# Patient Record
Sex: Male | Born: 1935 | Race: White | Hispanic: No | Smoking: Never smoker
Health system: Southern US, Community
[De-identification: ages and names within clinical notes are randomized; demographics above are authoritative.]

## PROBLEM LIST (undated history)

## (undated) DIAGNOSIS — I509 Heart failure, unspecified: Secondary | ICD-10-CM

## (undated) DIAGNOSIS — K7460 Unspecified cirrhosis of liver: Secondary | ICD-10-CM

## (undated) DIAGNOSIS — C801 Malignant (primary) neoplasm, unspecified: Secondary | ICD-10-CM

## (undated) HISTORY — PX: OTHER SURGICAL HISTORY: SHX169

## (undated) HISTORY — PX: JOINT REPLACEMENT: SHX530

## (undated) HISTORY — PX: CARDIAC SURGERY: SHX584

---

## 2004-06-25 ENCOUNTER — Ambulatory Visit: Payer: Self-pay | Admitting: Ophthalmology

## 2004-07-02 ENCOUNTER — Ambulatory Visit: Payer: Self-pay | Admitting: Ophthalmology

## 2004-08-20 ENCOUNTER — Ambulatory Visit: Payer: Self-pay | Admitting: Ophthalmology

## 2004-08-27 ENCOUNTER — Ambulatory Visit: Payer: Self-pay | Admitting: Ophthalmology

## 2004-10-16 ENCOUNTER — Ambulatory Visit: Payer: Self-pay | Admitting: Rheumatology

## 2005-09-25 ENCOUNTER — Ambulatory Visit: Payer: Self-pay | Admitting: Unknown Physician Specialty

## 2005-11-20 ENCOUNTER — Other Ambulatory Visit: Payer: Self-pay

## 2005-11-25 ENCOUNTER — Ambulatory Visit: Payer: Self-pay | Admitting: Family Medicine

## 2005-12-01 ENCOUNTER — Ambulatory Visit: Payer: Self-pay | Admitting: Internal Medicine

## 2005-12-04 ENCOUNTER — Inpatient Hospital Stay (HOSPITAL_COMMUNITY): Admission: RE | Admit: 2005-12-04 | Discharge: 2005-12-16 | Payer: Self-pay | Admitting: Surgery

## 2005-12-25 ENCOUNTER — Ambulatory Visit: Payer: Self-pay | Admitting: Internal Medicine

## 2006-01-06 ENCOUNTER — Encounter: Admission: RE | Admit: 2006-01-06 | Discharge: 2006-01-06 | Payer: Self-pay | Admitting: Surgery

## 2006-01-14 ENCOUNTER — Encounter: Payer: Self-pay | Admitting: Internal Medicine

## 2006-01-27 ENCOUNTER — Encounter: Payer: Self-pay | Admitting: Internal Medicine

## 2006-02-27 ENCOUNTER — Encounter: Payer: Self-pay | Admitting: Internal Medicine

## 2006-03-28 ENCOUNTER — Encounter: Payer: Self-pay | Admitting: Internal Medicine

## 2006-04-28 ENCOUNTER — Encounter: Payer: Self-pay | Admitting: Internal Medicine

## 2006-07-15 ENCOUNTER — Inpatient Hospital Stay (HOSPITAL_COMMUNITY): Admission: RE | Admit: 2006-07-15 | Discharge: 2006-07-18 | Payer: Self-pay | Admitting: Neurosurgery

## 2006-12-01 ENCOUNTER — Inpatient Hospital Stay (HOSPITAL_COMMUNITY): Admission: RE | Admit: 2006-12-01 | Discharge: 2006-12-07 | Payer: Self-pay | Admitting: Orthopaedic Surgery

## 2006-12-03 ENCOUNTER — Encounter: Payer: Self-pay | Admitting: Internal Medicine

## 2007-01-12 ENCOUNTER — Encounter: Payer: Self-pay | Admitting: Orthopaedic Surgery

## 2007-01-28 ENCOUNTER — Encounter: Payer: Self-pay | Admitting: Orthopaedic Surgery

## 2007-05-03 ENCOUNTER — Encounter: Admission: RE | Admit: 2007-05-03 | Discharge: 2007-05-03 | Payer: Self-pay | Admitting: Neurosurgery

## 2008-06-06 ENCOUNTER — Encounter: Admission: RE | Admit: 2008-06-06 | Discharge: 2008-06-06 | Payer: Self-pay | Admitting: Neurosurgery

## 2008-08-02 ENCOUNTER — Inpatient Hospital Stay (HOSPITAL_COMMUNITY): Admission: RE | Admit: 2008-08-02 | Discharge: 2008-08-04 | Payer: Self-pay | Admitting: Neurosurgery

## 2010-05-05 LAB — COMPREHENSIVE METABOLIC PANEL
BUN: 16 mg/dL (ref 6–23)
Creatinine, Ser: 0.96 mg/dL (ref 0.4–1.5)
GFR calc non Af Amer: >60 mL/min (ref >60)
Glucose, Bld: 100 mg/dL — ABNORMAL HIGH (ref 70–99)
Potassium: 4.8 mEq/L (ref 3.5–5.1)
Sodium: 138 mEq/L (ref 135–145)
Total Bilirubin: 1.2 mg/dL (ref 0.3–1.2)

## 2010-05-05 LAB — DIFFERENTIAL
Basophils Relative: 1 % (ref 0–1)
Eosinophils Absolute: 0.1 10*3/uL (ref 0.0–0.7)
Eosinophils Relative: 1 % (ref 0–5)
Lymphs Abs: 1.3 10*3/uL (ref 0.7–4.0)
Neutro Abs: 3 10*3/uL (ref 1.7–7.7)

## 2010-05-05 LAB — URINALYSIS, ROUTINE W REFLEX MICROSCOPIC
Urobilinogen, UA: 0.2 mg/dL (ref 0.0–1.0)
pH: 7 (ref 5.0–8.0)

## 2010-05-05 LAB — CBC
MCHC: 35.4 g/dL (ref 30.0–36.0)
MCV: 100.3 fL — ABNORMAL HIGH (ref 78.0–100.0)
Platelets: 160 10*3/uL (ref 150–400)

## 2010-05-05 LAB — PROTIME-INR: Prothrombin Time: 13.2 seconds (ref 11.6–15.2)

## 2010-06-11 NOTE — Op Note (Signed)
NAME:  Sean Jensen, Sean Jensen NO.:  [id number]   MEDICAL RECORD NO.:  [id number]          PATIENT TYPE:  INP   LOCATION:  5022                         FACILITY:  [facility]   PHYSICIAN:  Claude Manges. Whitfield, M.D.DATE OF BIRTH:  [birth year]   DATE OF PROCEDURE:  12/01/2006  DATE OF DISCHARGE:                               OPERATIVE REPORT   PREOPERATIVE DIAGNOSIS:  Osteoarthritis, right knee.   POSTOPERATIVE DIAGNOSIS:  Osteoarthritis, right knee.   PROCEDURE:  Right total knee replacement.   SURGEON:  Claude Manges. Cleophas Dunker, M.D.   ASSISTANT:  Arlys John D. Petrarca, P.A.-C.   ANESTHESIA:  General with supplemental femoral nerve block.   COMPLICATIONS:  None.   COMPONENTS:  DePuy LCS standard femoral component, a #4 keeled tibial  tray with a 12.5 mm bridging bearing, a rotating metal backed three  pegged patella.  All was secured with polymethyl methacrylate.   OPERATIVE PROCEDURE:  With the patient comfortable on the operating  table and under general orotracheal anesthesia, the nursing staff  inserted a Foley catheter, there was clear yellow urine.  The right  lower extremity was then placed in a thigh tourniquet and the leg was  then prepped with Betadine scrub and then DuraPrep from the tourniquet  to the mid foot.  Sterile draping was performed.  With the extremity  still elevated, it was Esmarch exsanguinated with a proximal tourniquet  at 350 mmHg.   A midline longitudinal incision was made centered about the patella  extending from the superior pouch to the tibial tubercle. Via sharp  dissection, the incision was carried down to the subcutaneous tissue.  The first layer of capsule was incised in the midline.  A medial  parapatellar incision was then made with the Bovie.  The joint was  entered with evidence of a clear yellow joint effusion.  The patella was  then everted 180 degrees laterally and the knee flexed to 90 degrees.  There was a fixed varus position and a  fixed flexure contracture. A  medial release was performed along the tibia.  Osteophytes were removed  about the femur, both medially and laterally.  There was complete  absence of articular cartilage in the medial femoral condyle with  eburnated bone and to a great extent along the medial tibial plateau.  There was significant chondromalacia laterally with some areas of  exposed bone.  There were large osteophytes about the patella with areas  of exposed bone.  There were also osteophytes along the medial tibial  plateau which were removed with a rongeur.  Preoperatively, we had  measured a standard femoral component.  This was confirmed  intraoperatively.   The tibial alignment jig was then applied.  The initial cut was made  transversely on the proximal tibia at about a 6 or 7 degrees posterior  declination.  We felt we were a little bit tight and removed an  additional 2 mm.  Subsequent cuts were then made on the femur with a 4  degrees distal femoral valgus cut.  MCL and LCL remained intact.  Laminar spreaders were then  inserted to both compartments were removed  and medial lateral menisci as well as PCL and LCL.  Osteophytes in the  posterior femoral condyles were removed with a curved 3/4 inch  osteotome.  There was a Baker's cyst noted posteriorly and medially and  this was removed with the Bovie.  The final cut was then made on the  femur with the tapered jig and the holes were made in the femoral  condyle to accept the femoral component.  A McKale retractor was then  placed about the tibia, it was advanced anteriorly.  We measured a #4  tibial tray, the trial jig was then applied, the center hole was made  followed by the keeled cut. After application, we trialed both the 10  and then a 12.5 mm bridging bearing and felt the 12.5 gave [place] full  extension and no opening with varus or valgus stress.   The patella was then prepared by removing 10 mm of bone leaving 15 mm of   patellar thickness.  The trial jig was applied, the three holes made,  and the trial patella was applied with a perfect fit without  subluxation.  Trial components were removed.  The joint was copiously  irrigated with jet saline.  Each of the final components were then  applied with polymethyl methacrylate with the knee in extension.  All  extraneous methacrylate was removed.  The patella was applied with  methacrylate and the patellar clamp.  After complete maturation, the  joint was explored.  There was some extraneous methacrylate in the  intercondylar notch which was removed with an osteotome.  Otherwise, it  was clear.  Bone wax was applied to bleeding bone.  The tourniquet was  deflated.  We had very little bleeding.  Marcaine 0.25% with epinephrine  was injected into the capsule.   The deep capsule was closed with interrupted #1 Ethibond, the  superficial capsule closed with running 0 Vicryl, subcu with 2-0 Vicryl,  and the skin closed with skin clips.  A sterile bulky dressing was  applied.  The patient tolerated the procedure well without  complications.      Claude Manges. Cleophas Dunker, M.D.  Electronically Signed     PWW/MEDQ  D:  12/01/2006  T:  12/01/2006  Job:  [id number]

## 2010-06-11 NOTE — Op Note (Signed)
NAME:  Sean Jensen, Sean Jensen NO.:  [id number]   MEDICAL RECORD NO.:  [id number]          PATIENT TYPE:  INP   LOCATION:  3311                         FACILITY:  [facility]   PHYSICIAN:  Payton Doughty, M.D.      DATE OF BIRTH:  Nov 16, 1921   DATE OF PROCEDURE:  07/15/2006  DATE OF DISCHARGE:                               OPERATIVE REPORT   PREOPERATIVE DIAGNOSIS:  Lumbar spondylosis with spinal stenosis at L3-  4, L4-5.   POSTOPERATIVE DIAGNOSIS:  Lumbar spondylosis with spinal stenosis at L3-  4, L4-5.   OPERATIVE PROCEDURE:  L3-4, L4-5 laminotomy and foraminotomy done  bilaterally.   SURGEON:  Payton Doughty, M.D.  Service:  Neurosurgery.   ANESTHESIA:  General endotracheal.   PREPARATION:  Sterile Betadine prep and scrub with alcohol wipe.   COMPLICATIONS:  None.   NURSE ASSISTANT:  [place]   DOCTOR ASSISTANT:  Hilda Lias, M.D.   BODY OF TEXT:  An 77 year old gentleman with neurogenic claudication  secondary to severe lumbar spondylosis, right worse the left, at L3-4  and L4-5.  Taken to the operating room and smoothly anesthetized and  intubated, placed prone on the operating table following shave, prep and  draped in the usual sterile fashion.  The skin was infiltrated with 1%  lidocaine 1:400,000 epinephrine.  The skin was incised from L3 to the  top of L5 and the lamina of L3, L4 and the top of L5 were exposed  bilaterally in subperiosteal plane.  Intraoperative x-ray showed a  marker to be under L3.  Having confirmed correctness of the level,  laminotomy and foraminotomy was carried out to the top of the ligamentum  flavum, which was removed in a retrograde fashion at each level.  The  bone was extremely thick, particularly on the right side, with  significant compression of the lateral recess.  L3-4 was a bit worse  than L4-5.  Following complete decompression of the neural foramina and  the lateral recesses, these were carefully explored and found to  be  open.  The wound was irrigated.  Hemostasis assured.  Depo-Medrol-soaked  fat was placed in the laminotomy defects.  Successive layers of 0  Vicryl, 2-0 Vicryl and 3-0 nylon were used to close.  Betadine Telfa  dressing was applied, made occlusive with OpSite.  The patient returned  to the recovery room in good condition.           ______________________________  Payton Doughty, M.D.     MWR/MEDQ  D:  07/15/2006  T:  07/15/2006  Job:  [id number]

## 2010-06-11 NOTE — H&P (Signed)
NAME:  TRESTON, COKER NO.:  [id number]   MEDICAL RECORD NO.:  [id number]          PATIENT TYPE:  INP   LOCATION:  3172                         FACILITY:  [facility]   PHYSICIAN:  Payton Doughty, M.D.      DATE OF BIRTH:  04/07/1921   DATE OF ADMISSION:  07/15/2006  DATE OF DISCHARGE:                              HISTORY & PHYSICAL   ADMISSION DIAGNOSIS:  Lumbar spondylosis L3-4, L4-5.   BODY:  Abdinasir is an 77 year old right-handed white gentleman in the past  6 to 8 years has been having increased pain in his back down his right  leg when he is up and about.  Left leg is not affected.  He noticed he  cannot stand long.  Sitting and lying poses no problems for him.  MRI  showed spinal stenoses, now admitted for a decompression L3-4 and L4-5.   PAST MEDICAL HISTORY:  Otherwise remarkable for coronary artery disease,  he had a bypass last November and is doing well.   MEDICATIONS:  1. Crestor.  2. Doxazosin.  3. Multivitamins.  4. Fexofenadine.  5. Aspirin.  6. Tylenol.   ALLERGIES:  HE HAS NO ALLERGIES.   He has no other problems.   SOCIAL HISTORY:  He does not smoke or drink.  He has retired from [employer].   FAMILY HISTORY:  Mom died at 15 of hypertension and stroke.  Father died  at 5 of stroke and cancer.   REVIEW OF SYSTEMS:  Marked for glasses, tinnitus, nasal congestion, leg  weakness, back pain, leg pain, arthritis, and skin cancer.   PHYSICAL EXAMINATION:  HEENT:  Within normal limits.  NECK:  He has reasonable range of motion of neck.  CHEST:  Clear.  CARDIAC EXAM:  Regular rate and rhythm.  I do not hear a murmur at this  time.  ABDOMEN:  Nontender with no hepatosplenomegaly.  EXTREMITIES:  No clubbing, cyanosis.  Peripheral pulses are good.  GU EXAM:  Deferred.  NEUROLOGICALLY:  He is awake, alert, and oriented.  His cranial nerves  are intact.  MOTOR EXAM:  He has 5/5 strength throughout the upper and  lower extremities and sensation not  particularly diminished.  Straight  leg raise is positive on the right.  He has somewhat swollen arthritic  right knee and there is no reflex in the knees or ankle.   IMAGING:  MRI demonstrates significant spinal stenosis L3-4 and L4-5,  which are the second and third low most levels upon his exam.  L4-5  appears to be most real effected with significant compression of right  lateral recess.   CLINICAL IMPRESSION:  Neurogenic claudication secondary to spinal  stenosis.   PLAN:  For lumbar decompression L3-4 and 4-5.  The risks and benefits  were discussed with him and he wishes to proceed.           ______________________________  Payton Doughty, M.D.     MWR/MEDQ  D:  07/15/2006  T:  07/15/2006  Job:  [id number]

## 2010-06-11 NOTE — Discharge Summary (Signed)
NAME:  Sean Jensen, Sean Jensen NO.:  [id number]   MEDICAL RECORD NO.:  [id number]          PATIENT TYPE:  INP   LOCATION:  5022                         FACILITY:  [facility]   PHYSICIAN:  Claude Manges. Whitfield, M.D.DATE OF BIRTH:  November 09, 1921   DATE OF ADMISSION:  12/01/2006  DATE OF DISCHARGE:  12/06/2006                               DISCHARGE SUMMARY   ADDENDUM:  Previous dictation of discharge summary was completed on  December 04, 2006.  Due to difficulty in obtaining the discharge summary,  the patient was unable to be transferred to the nursing facility that he  has accepted.  Therefore, we have had to keep him in the hospital until  November 10, awaiting placement.  He has had essentially an unremarkable  hospital course since his previous discharge summary.  His only problem  since has been that of constipation, which he is being treated for.  He  has had new labs which revealed a hemoglobin of 11.2, hematocrit 32.1%,  white count 6900, platelets 207,000.  Electrolytes revealed a sodium of  135, potassium 4.3, chloride 101, CO2 29, glucose 111, BUN 17,  creatinine 0.97.  His INR of November 9 was 1.4 with a PT of 17.2.  His  discharge instructions have not changed.  He will hopefully be  transferred to the skilled nursing facility on November 10 in the  morning.      Oris Drone Petrarca, P.A.-C.      Claude Manges. Cleophas Dunker, M.D.  Electronically Signed    BDP/MEDQ  D:  12/06/2006  T:  12/06/2006  Job:  [id number]

## 2010-06-11 NOTE — Discharge Summary (Signed)
NAME:  Sean Jensen, Sean Jensen NO.:  [id number]   MEDICAL RECORD NO.:  [id number]          PATIENT TYPE:  INP   LOCATION:  5022                         FACILITY:  [facility]   PHYSICIAN:  Claude Manges. Whitfield, M.D.DATE OF BIRTH:  11-Feb-1921   DATE OF ADMISSION:  12/01/2006  DATE OF DISCHARGE:  12/04/2006                               DISCHARGE SUMMARY   ADMISSION DIAGNOSIS:  Osteoarthritis of the right knee.   DISCHARGE DIAGNOSES:  1. Osteoarthritis of the right knee.  2. Hypertension.  3. Coronary artery disease.  4. Mild hyponatremia.   PROCEDURE:  Right total knee arthroplasty December 01, 2006 by Dr. Norlene Campbell, assisted by Oris Drone. Petrarca, PA-C.   HISTORY:  Sean Jensen is an 77 year old white male who presents with a 2-  year history of gradual onset of progressive right knee pain.  He denies  any history of injury or trauma.  He has pain, which is present with  weightbearing and sharp sensations diffusely about the joint with  occasional radiation up and down the leg.  It increases with standing,  walking, and lifting.  Sitting also decreases it.  Tylenol has been  moderately helpful.  He is not using assistive devices at this time.  He  has had corticosteroid injections with some relief.  He is admitted at  this time for total joint replacement.   HOSPITAL COURSE:  An 77 year old admitted December 01, 2006 after  appropriate laboratory studies were obtained, as well as Ancef 1 gram IV  on call to the operating room.  He was taken to the operating room where  he underwent a right total knee arthroplasty by Dr. Norlene Campbell,  assisted by Jacqualine Code, PA-C.  This was an LCS standard femoral  component with a #4 keel tibial tray and 12.5-mm bridging bearing.  There was a rotating metal backed 3 peg patella was also used.  He  tolerated the procedure well.  He had a Foley placed intraoperatively.  He was begun on heparin 3000 units subcu q.12 hours until his  Coumadin  became therapeutic.  Ancef 1 gram IV q.8 hours times 3 doses was  continued.  A Dilaudid reduced dose PCA pump was used for pain  management.  Lisinopril 10 mg daily was also ordered.  Consult with PT  and care management were made.  He was allowed to be weightbearing as  tolerated on the right.  He was allowed out of bed to a chair the  following day.  He did have some problems with some nausea.  A KUB was  ordered, not showing any type of ileus.  We did increase his IV fluids  to 80 an hour on the 5th because of some hypotension and decreased urine  output.  This improved tremendously.  He then had the PCA, Foley, and  heparin discontinued on the 6th of November.  The remainder of his  hospital course was uneventful.  Once he was ambulatory and stable, he  was transferred to the skilled nursing facility for continued physical  therapy and rehab.   LABORATORY  DATA:  He was admitted with a hemoglobin of 16.1, hematocrit  46.2%, white count 6200 and platelets were 206,000.  Discharge  hemoglobin was 11.6, hematocrit 33.1%, white count 10,900, platelets  156,000.  Preop sodium 137, potassium 4.2, chloride 105, CO2 24, glucose  117, BUN 19, creatinine 1.26.  GFR was 55.  Bilirubin 1.1, alk-phos 89,  SGOT 54, SGPT 45, total protein 6.9, albumin 4.4, calcium 9.4.  Discharge chemistries - sodium 134, potassium 4.0, chloride 100, CO2 27,  glucose 104, BUN 14, creatinine 1.04.  Urinalysis was benign for a  voided urine.  Blood type 0+, antibody screen negative.  Discharge INR  1.5 with a pro-time of 18.7.   RADIOGRAPHIC STUDIES:  Abdomen of November 5, revealed no obstruction or  evidence of ileus.  Chest x-ray of July 10, 2006 revealed status post  CABG. No active disease.  EKG of June 13 showed sinus rhythm with first-  degree AV block, otherwise normal EKG.   DISCHARGE INSTRUCTIONS:  He may have at discharge a regular diet.  He is  to ambulate 3-point gait, weightbearing as  tolerated on the right knee.  A CPM from 0 to 70 degrees, increasing daily by 10 degrees for 6-8 hours  per day.  He is to follow the blue instruction sheet.  No driving or  lifting for 6 weeks.  He may shower on the 8th of November.  He should  have a daily dressing change to the wound.   DISCHARGE MEDICATIONS:  1. Percocet 1-2 tablets every 4 hours as needed for pain.  2. Robaxin 500 mg 1 p.o. q.6h. p.r.n. spasms.  3. Lisinopril 10 mg daily.  4. Colace 100 mg b.i.d.  5. Tylenol 325 mg to 650 mg p.o. q.4h. p.r.n.  6. Crestor 5 mg h.s.  7. Fexofenadine 180 mg daily.  8. Nasacort AQ q.h.s.   He is not to have any doxazosin or aspirin at this time.   FOLLOW UP:  He needs to follow back up at our office 2 weeks  postoperatively for reevaluation.      Oris Drone Petrarca, P.A.-C.      Claude Manges. Cleophas Dunker, M.D.  Electronically Signed    BDP/MEDQ  D:  12/04/2006  T:  12/05/2006  Job:  [id number]

## 2010-06-11 NOTE — H&P (Signed)
NAME:  Sean Jensen, Sean Jensen NO.:  [id number]   MEDICAL RECORD NO.:  [id number]          PATIENT TYPE:  INP   LOCATION:  3109                         FACILITY:  [facility]   PHYSICIAN:  Payton Doughty, M.D.      DATE OF BIRTH:  [birth year]   DATE OF ADMISSION:  08/02/2008  DATE OF DISCHARGE:                              HISTORY & PHYSICAL   ADMITTING DIAGNOSIS:  Cervical spondylosis with myelopathy.   BODY OF TEXT:  A very nice now 77 year old right-handed white gentleman  who is operated on his back several years ago, he did relatively well.  He has had increasing pain in his neck and dysfunction of his hands.  He  has diminished grip, interosseous function, and a positive Hofmann's.  Because of this, an MR was obtained, it showed significant spinal  stenosis at C3-4 and C4-5, and he is now admitted for an anterior  decompression and fusion.   MEDICAL HISTORY:  Remarkable for coronary artery disease.  He had a  bypass in November 2007 and did well.   MEDICATIONS:  Crestor, doxazosin, vitamin, fexofenadine, aspirin, and  Tylenol.   ALLERGIES:  He has no allergies.   SURGICAL HISTORY:  He had no other operations.   SOCIAL HISTORY:  He does not smoke or drink.  He is retired from [employer].   FAMILY HISTORY:  Mom died at 66 of hypertension.  Dad died at 32 of  throat cancer.   REVIEW OF SYSTEMS:  Remarkable for glasses, tinnitus, nasal congestion,  leg weakness, back pain, leg pain, arthritis, neck pain, and skin  cancer.   PHYSICAL EXAMINATION:  HEENT:  Normal limits.  He has reasonable range  of motion in his neck with no Lhermitte's.  CHEST:  Clear.  CARDIAC:  Regular rate and  rhythm.  ABDOMEN:  Nontender with no hepatosplenomegaly.  EXTREMITIES:  Without clubbing, cyanosis.  GU:  Deferred.  Peripheral pulses are good.  NEUROLOGIC:  He is awake, alert, and oriented.  Cranial nerves are  intact.  Motor exam shows 5/5 strength throughout, save for the grip in  the  interosseous that are about 5-/5.  Sensory dysesthesias are not  reproduced with head turning.  Reflexes are 3 at the biceps, 3 at the  triceps, 2 at the brachioradialis.  Hoffmann's is mildly positive.   MRI has also been reviewed above.  Basically shows spondylosis  throughout with cord compression C3-4 and C4-5.   CLINICAL IMPRESSION:  Cervical spondylosis with early myelopathy.   PLAN:  The plan is for an anterior decompression and fusion at C3-4 and  C4-5.  The risks and benefits have been discussed with him, and he  wished to proceed.           ______________________________  Payton Doughty, M.D.     MWR/MEDQ  D:  08/02/2008  T:  08/02/2008  Job:  [id number]

## 2010-06-11 NOTE — Op Note (Signed)
NAME:  Sean Jensen, Sean Jensen NO.:  [id number]   MEDICAL RECORD NO.:  [id number]          PATIENT TYPE:  INP   LOCATION:  3109                         FACILITY:  [facility]   PHYSICIAN:  Payton Doughty, M.D.      DATE OF BIRTH:  [birth year]   DATE OF PROCEDURE:  08/02/2008  DATE OF DISCHARGE:                               OPERATIVE REPORT   PREOPERATIVE DIAGNOSIS:  Spondylosis with myelopathy at C3-4 and C4-5.   POSTOPERATIVE DIAGNOSIS:  Spondylosis with myelopathy at C3-4 and C4-5.   PROCEDURE:  C3-4, C4-5 anterior cervical decompression and fusion with  Reflex Hybrid plate.   SURGEON:  Payton Doughty, MD   ANESTHESIA:  General endotracheal.   PREPARATION:  Prepped and draped with alcohol wipe.   COMPLICATIONS:  None.   NURSE ASSISTANT:  Bedelia Person, MD   DOCTOR ASSISTANT:  Clydene Fake, MD   BODY OF TEXT:  This 77 year old gentleman with severe cervical  spondylitic myelopathy was taken to operating room, smoothly  anesthetized and intubated, placed supine on the operating table with  the neck slightly extended in the Halter head traction.  Following  shave, prep, and drape in usual sterile fashion, skin was incised from  midline to medial border of the sternocleidomastoid muscle on the left  side.  The platysma was identified, elevated, divided, and undermined.  Sternocleidomastoid was identified.  Medial dissection revealed the  carotid artery, retracted laterally to left, trachea and esophagus  retracted laterally to the right, exposing the bones in the anterior  cervical spine.  Marker was placed.  Intraoperative x-ray obtained to  confirm correctness level and confirmed correctness level.  Diskectomy  was carried out at C3-4 and C4-5 under gross observation.  We then  brought in the operating microscope with a disk space spreader in place,  used microdissection technique to remove the remaining disks, remove the  osteophyte, divide the posterior longitudinal  ligament, carefully  explore and open the foramina bilaterally.  Most of the compression was  central, it was worse at C3-4 and C4-5, and it was a combination of disk  and osteophyte.  Following complete decompression, the wound was  irrigated.  Hemostasis assured.  An 8-mm bone graft was fashioned and  patellar allograft tapped into place.  Reflex Hybrid plate of 34 mm in  length was placed using 12-mm screws, 2 in C3, 2 in C4, and 2 in C5.  Intraoperative x-ray showed good placement of bone graft,  plate, and screws.  Successive layers of 3-0 Vicryl and 4-0 Vicryl were  used to close.  Benzoin and Steri-Strips were placed and made occlusive  with Telfa and OpSite.  The patient was placed in Aspen collar, and  returned to recovery room.  The esophagus was carefully inspected and  found to be free of lesions prior to closure.           ______________________________  Payton Doughty, M.D.     MWR/MEDQ  D:  08/02/2008  T:  08/03/2008  Job:  [id number]

## 2010-06-14 NOTE — Discharge Summary (Signed)
NAME:  WOLF, BOULAY NO.:  [id number]   MEDICAL RECORD NO.:  [id number]           PATIENT TYPE:   LOCATION:                                 FACILITY:   PHYSICIAN:  Danae Orleans. Venetia Maxon, M.D.  DATE OF BIRTH:  Jan 02, 1922   DATE OF ADMISSION:  07/15/2006  DATE OF DISCHARGE:  07/18/2006                               DISCHARGE SUMMARY   ADMISSION DIAGNOSES:  1. Lumbosacral spondylosis.  2. Coronary atherosclerosis of unspecified type, status post      aortocoronary bypass.  3. Hypertrophy of the prostate without urinary obstruction.  4. Long-term use of aspirin.  5. Unspecified constipation.   FINAL DIAGNOSES:  1. Lumbosacral spondylosis.  2. Coronary atherosclerosis of unspecified type, status post      aortocoronary bypass.  3. Hypertrophy of the prostate without urinary obstruction.  4. Long-term use of aspirin.  5. Unspecified constipation.   HISTORY OF ILLNESS AND HOSPITAL COURSE:  Rashaan Bruun is an 77 year old  man with an 8-year history of pain of his right leg with neurogenic  claudication.  He has an MRI which demonstrated spinal stenosis.  He was  admitted to the hospital on a same day as procedure basis and underwent  uncomplicated decompression for spinal stenosis at L3-4 and L4-5.  Postoperatively he was gradually mobilized, did well the 21st, and was  discharged home.   FINAL DIAGNOSIS:  1. Lumbosacral spondylosis.  2. Coronary atherosclerosis of unspecified type, status post      aortocoronary bypass.  3. Hypertrophy of the prostate without urinary obstruction.  4. Long-term use of aspirin.  5. Unspecified constipation.   FOLLOW-UP:  1 week for suture removal.   DISCHARGE MEDICATIONS:  Vicodin and Valium.      Danae Orleans. Venetia Maxon, M.D.  Electronically Signed     JDS/MEDQ  D:  01/29/2007  T:  01/29/2007  Job:  [id number]

## 2010-06-14 NOTE — Discharge Summary (Signed)
NAME:  Sean Jensen, Sean Jensen                 ACCOUNT NO.:  [id number]   MEDICAL RECORD NO.:  [id number]          PATIENT TYPE:  INP   LOCATION:  2032                         FACILITY:  [facility]   PHYSICIAN:  Evelene Croon, M.D.     DATE OF BIRTH:  Jan 24, 1922   DATE OF ADMISSION:  12/04/2005  DATE OF DISCHARGE:                               DISCHARGE SUMMARY   PRIMARY DIAGNOSIS:  Severe three-vessel coronary artery disease.   IN-HOSPITAL DIAGNOSES:  1. Postoperative dysphagia.  2. Postoperative atrial fibrillation.  3. Postoperative confusion.  4. Postoperative ileus.  5. Acute renal insufficiency.   SECONDARY DIAGNOSES:  1. History of arthritis.  2. Hyperlipidemia.  3. History of benign prostatic hypertrophy.  4. Spinal stenosis.  5. Oral thrush.   IN-HOSPITAL OPERATIONS AND PROCEDURES:  Coronary artery bypass grafting  x4 using a left internal mammary artery graft to left anterior  descending coronary, saphenous vein graft to diagonal branch of the left  anterior descending, saphenous vein graft to obtuse marginal branch of  left circumflex, saphenous vein graft to posterior descending branch of  right coronary artery.  Endoscopic vein harvesting from right leg.   HISTORY AND PHYSICAL AND HOSPITAL COURSE:  The patient is an 77 year old  gentleman with no prior cardiac history who was being evaluated for  lumbar spine surgery for spinal stenosis.  A preoperative stress test  was performed due to his cardiac risk factors and showed an inferior  ischemia.  Following this the patient was taken for cardiac  catheterization by Dr. Arnoldo Hooker at [facility].  This showed severe three-vessel coronary artery disease with a  90% proximal LAD stenosis and an occluded right coronary artery that was  collateralized from the LAD.  There is also a high-grade ostial first  diagonal stenosis and a high-grade left circumflex supplying a single  marginal branch.  Left  ventricular function was well-preserved.  Following catheterization, the patient was referred to Dr. Laneta Simmers to  undergo evaluation for a possible coronary artery bypass grafting.  Dr.  Laneta Simmers saw the patient in the office and discussed surgery with the  patient.  Risks and benefits were discussed.  The patient acknowledged  understanding and agreed to proceed.  Surgery was scheduled for December 04, 2005.  For details of the patient's past medical history and physical  exam please see dictated history and physical.   The patient was taken to the operating room December 04, 2005 where he  underwent coronary artery bypass grafting x4 using a left internal  mammary artery graft to left anterior descending, saphenous vein graft  to diagonal branch, saphenous vein graft to obtuse marginal branch,  saphenous vein graft to posterior descending branch.  Endoscopic vein  harvesting was done from the right leg.  The patient tolerated this  procedure well and was transferred to the intensive care unit in stable  condition.  Following surgery the patient was seen to be hemodynamically  stable.  The patient was extubated later evening/early morning following  surgery.  Following extubation the patient did display some  postoperative confusion.  This did resolve by postop day #3.  He was  alert and oriented x3 on day #3.  Postop day #1 the patient's creatinine  was noted to have elevated to 1.6.  By later that evening he had  increased to 2.7.  Renal dopamine was started at that time.  His  creatinine was monitored closely.  By postop day #4 it had come back to  normal and was 1.1.  It remained stable during the remainder of his  hospital course.  Postop day #2 the patient was diagnosed to have mild  to moderate colonic ileus per KUB.  Placed n.p.o. at that time.  Follow-  up KUB was obtained in the morning.  The patient's bowels were moving by  that morning.  Follow-up KUB showed improved gaseous  distension.  He was  started on diet slowly.  After initiation of diet, the patient was  complaining of dysphagia.  Speech pathology was consulted.  A bedside  swallow evaluation was done December 08, 2005 and this showed clinical  signs of oropharyngeal dysphagia.  The patient was started on D3 with  nectar thick liquids.  A FES was done December 09, 2005 which showed  moderate pharyngeal dysphagia characterized by penetration of vocal  cords with thin liquids.  The patient was considered on D3 nectar-thick  liquids.  A repeat fiberoptic endoscopic evaluation ordered for today  which has currently not been done yet.  The patient also developed  postoperative atrial fibrillation day #2.  He was started on IV  amiodarone.  After initiation of the amiodarone, the patient converted  back to normal sinus rhythm.  He remained in normal sinus rhythm and  switched to p.o.  The patient had no other bouts of atrial fibrillation.   The patient was transferred out to 2000 postop day #5.  He was out of  bed ambulating well with assistance.  Vital signs were monitored and  seemed to be stable.  He was able to be weaned off oxygen saturating  greater than 90% on room air.  Incision is clean, dry and intact and  healing well.  The patient remained hemodynamically stable  postoperatively.  His last hemoglobin and hematocrit 12.6 and 36.6.  He  was afebrile postoperatively.  The patient was back near his  preoperative weight prior to discharge home.  He did develop oral thrush  and was started on nystatin prior to discharge home.   The patient is tentatively ready for discharge home in morning.   FOLLOW-UP APPOINTMENTS:  A follow-up appointment has been arranged with  Dr. Laneta Simmers for January 06, 2006 at 11:45 a.m.  The patient will need to  obtain AP and lateral chest x-ray 1 hour prior to this appointment.  He will need to contact Dr. Philemon Kingdom office to schedule follow-up  appointment with him in 2  weeks.   ACTIVITY:  Patient instructed no driving until released to do so, no  heavy lifting over 10 pounds.  He is to ambulate two to four times per  day, progress as tolerated and continue his breathing exercises.  Home  health nurse, PT, speech therapy and aid have been arranged.   DIET:  Pending patient's FES study today will base if the patient will  remain D3 nectar-thick liquids versus advancing diet.   INCISIONAL CARE:  The patient to shower washing his incisions using soap  and water.  He is contact the office if he develops any drainage or  opening from any of  his incision sites.   DISCHARGE MEDICATIONS:  1. Aspirin 325 mg daily.  2. Lopressor 50 mg b.i.d.  3. Crestor 5 mg at night.  4. Amiodarone 200 mg b.i.d.  5. Doxazosin 4 mg at night.  6. Lyrica 50 mg b.i.d.  7. Multivitamin daily.  8. Fexofenadine 180 mg daily.  9. Oxycodone 5 mg one to two tablets q.4-6 h p.r.n. pain.      Theda Belfast, [place]      Evelene Croon, M.D.  Electronically Signed    KMD/MEDQ  D:  12/15/2005  T:  12/15/2005  Job:  [id number]   cc:   Arnoldo Hooker, MD

## 2010-06-14 NOTE — Op Note (Signed)
NAME:  Sean Jensen, Sean Jensen                 ACCOUNT NO.:  [id number]   MEDICAL RECORD NO.:  [id number]          PATIENT TYPE:  INP   LOCATION:  2315                         FACILITY:  [facility]   PHYSICIAN:  Evelene Croon, M.D.     DATE OF BIRTH:  [birth year]   DATE OF PROCEDURE:  12/04/2005  DATE OF DISCHARGE:                               OPERATIVE REPORT   PREOPERATIVE DIAGNOSIS:  Severe three-vessel coronary disease.   POSTOPERATIVE DIAGNOSIS:  Severe three-vessel coronary disease.   OPERATIVE PROCEDURE:  Median sternotomy, extracorporeal circulation,  coronary artery bypass graft surgery x4 using a left internal mammary  artery graft to the left anterior descending coronary artery, with a  saphenous vein graft to the diagonal branch of the left anterior  descending, a saphenous vein graft to the obtuse marginal branch of the  left circumflex coronary artery, and a saphenous vein graft to the  posterior descending branch of the right coronary artery.  Endoscopic  vein harvesting from the right leg.   ATTENDING SURGEON:  Evelene Croon, M.D.   ASSISTANT:  Belenda Cruise Dominic, P.A.-C.   ANESTHESIA:  General endotracheal.   CLINICAL HISTORY:  This patient is an 77 year old gentleman with no  prior cardiac history, who was being evaluated for lumbar spine surgery  for spinal stenosis.  A preoperative stress test was performed due to  his cardiac risk factors and showed inferior ischemia.  He underwent  cardiac catheterization by Dr. Arnoldo Hooker at [facility] which showed severe three-vessel coronary artery disease  with a 90% proximal LAD stenosis and an occluded right coronary artery  that was collateralized from the LAD.  There was also a high-grade  ostial first diagonal stenosis and a high-grade left circumflex stenosis  supplying a single marginal branch.  Left ventricular function was well-  preserved.  After review of the angiogram and examination of the  patient,  it was felt that coronary artery bypass graft surgery was the  best treatment to prevent further ischemia and infarction and allow him  to safely undergo spine surgery.  I discussed the operative procedure  with the patient and his sister including alternatives, benefits, and  risks including bleeding, blood transfusion, infection, stroke,  myocardial infarction, graft failure, and death.  They understood and  agreed to proceed.   OPERATIVE PROCEDURE:  The patient was taken to the operating room and  placed on the table in a supine position.  After induction of general  endotracheal anesthesia, a Foley catheter was placed in the bladder  using sterile technique.  Then the chest, abdomen and both lower  extremities were prepped and draped in usual sterile manner.  The chest  was entered through a median sternotomy incision and the pericardium  opened in the midline.  Examination of the heart showed good ventricular  contractility.  The ascending aorta had no palpable plaques in it.   Then the left internal mammary artery was harvested from the chest wall  as a pedicle graft.  This was a medium-caliber vessel with excellent  blood flow through it.  At the  same time, a segment of greater saphenous  vein was harvested from the right leg using endoscopic vein harvest  technique.  This vein was of medium size and good quality.   The patient then heparinized and when an adequate activated clotting  time was achieved, the distal ascending aorta was cannulated using a 20-  [place] aortic cannula for arterial inflow.  Venous outflow was achieved  using a two-stage venous cannula through the right atrial appendage.  An  antegrade cardioplegia and vent cannula was inserted in the aortic root.   The patient was placed on cardiopulmonary bypass and the distal  coronaries identified.  The LAD was a medium-sized graftable vessel.  The first diagonal branch was small, but graftable.  The obtuse  marginal  was a small- to medium-sized vessel that was lying high on the lateral  wall and was located proximally in the fat near the atrioventricular  groove and was felt to be graftable here.  The right coronary artery  gave off a large posterior descending branch that had mild distal  disease in it and a small posterolateral branch.  The posterolateral  branch was fairly small and had diffuse disease in it and was not felt  to be graftable.  Both this and the posterior descending branch  communicate on angiogram.   Then the aorta was crossclamped and 500 mL of cold blood antegrade  cardioplegia were administered in the aortic root with quick arrest of  the heart.  Systemic hypothermia to 28 degrees centigrade and topical  hypothermia with iced saline were used.  A temperature probe was placed  in septum and insulating pad in the pericardium.   The first distal anastomosis was performed to the posterior descending  coronary artery.  The internal diameter of this vessel was 1.75 mm.  The  conduit that was used was a segment of greater saphenous vein and the  anastomosis performed in an end-to-side manner using continuous 7-0  Prolene suture.  Flow was measured through the graft and was excellent.   The second distal anastomosis was performed to the obtuse marginal  branch.  The internal diameter of this vessel was about 1.6 mm.  The  conduit that was used was a second segment of greater saphenous vein and  the anastomosis performed in an end-to-side manner using continuous 7-0  Prolene suture.  Flow was measured through the graft and was excellent.  Then a dose of cardioplegia was given down both vein grafts and in the  aortic root.   The third distal anastomosis was performed to the diagonal branch.  The  internal diameter was about 1.6 mm.  The conduit used was a third segment of greater saphenous vein and the anastomosis performed in an  end-to-side manner using continuous 7-0  Prolene suture.  Flow was  measured through the graft and was excellent.   The fourth distal anastomosis was performed to the midportion of the  left anterior descending coronary artery.  The internal diameter of this  vessel was about 2 mm.  The conduit that was used was a left internal  mammary graft and this was brought through an opening in the left  pericardium anterior to the phrenic nerve.  It was anastomosed to the  LAD in an end-to-side manner using continuous 8-0 Prolene suture.  The  pedicle was sutured to the epicardium with 6-0 Prolene sutures.  The  patient was rewarmed to 37 degrees centigrade.  With the crossclamp in  place, the  3 proximal vein graft anastomoses were performed to the  aortic root in an end-to-side manner using continuous 6-0 Prolene  suture.  Then the clamp was removed from mammary artery pedicle.  There  was rapid warming of the ventricular septum and return of spontaneous  ventricular fibrillation.  The crossclamp was removed with a time of 78  minutes and the patient converted into sinus arrest.  Then 2 temporary  right ventricular and right atrial pacing wires were placed and brought  out through the skin.  The patient was paced DDD at 90.  The proximal  and distal anastomoses were examined and were hemostatic.  The lines of  the grafts were satisfactory.  Graft markers were placed around the  proximal anastomoses.   When the patient had rewarmed to 37 degrees centigrade, he was weaned  from cardiopulmonary bypass on no inotropic agents.  Total bypass time  was 101 minutes.  Cardiac function appeared excellent with a cardiac  output of 4 L/min.  Protamine was given and the venous and aortic  cannulas were removed without difficulty.  Hemostasis was achieved.  Three chest tubes were placed with a tube in the posterior pericardium,  1 in the left pleural space and 1 in the anterior mediastinum.  The  pericardium was loosely reapproximated over the  heart.  Sternum was  closed with #6 stainless steel wires.  Fascia was closed with a  continuous #1 Vicryl suture.  Subcutaneous tissue was closed with a  continuous 2-0 Vicryl and skin with 3-0 Vicryl subcuticular closure.  The lower extremity vein harvest site was closed in layers in a similar  manner.  The sponge, needles and instrument counts were correct  according to the scrub nurse.  Dry sterile dressings were applied over  the incisions and around the chest tubes, which were hooked to Pleur-  evac suction.  The patient remained hemodynamically stable and was  transported to the SICU in guarded, but stable condition.      Evelene Croon, M.D.  Electronically Signed     BB/MEDQ  D:  12/04/2005  T:  12/05/2005  Job:  [id number]   cc:   Almon Register, [place], [place] Arnoldo Hooker MD  [facility] Cardiac Catheterization Laboratory

## 2010-09-25 ENCOUNTER — Other Ambulatory Visit: Payer: Self-pay | Admitting: Neurosurgery

## 2010-09-25 DIAGNOSIS — M545 Low back pain: Secondary | ICD-10-CM

## 2010-09-26 ENCOUNTER — Ambulatory Visit
Admission: RE | Admit: 2010-09-26 | Discharge: 2010-09-26 | Disposition: A | Discharge status: Home / Self Care | Payer: Medicare Other | Source: Ambulatory Visit | Attending: Neurosurgery | Admitting: Neurosurgery

## 2010-09-26 DIAGNOSIS — M545 Low back pain, unspecified: Secondary | ICD-10-CM

## 2010-10-09 ENCOUNTER — Other Ambulatory Visit: Payer: Self-pay | Admitting: Neurosurgery

## 2010-10-09 DIAGNOSIS — M47816 Spondylosis without myelopathy or radiculopathy, lumbar region: Secondary | ICD-10-CM

## 2010-10-10 ENCOUNTER — Ambulatory Visit
Admission: RE | Admit: 2010-10-10 | Discharge: 2010-10-10 | Disposition: A | Discharge status: Home / Self Care | Payer: Medicare Other | Source: Ambulatory Visit | Attending: Neurosurgery | Admitting: Neurosurgery

## 2010-10-10 VITALS — BP 186/80 | HR 87

## 2010-10-10 DIAGNOSIS — M47816 Spondylosis without myelopathy or radiculopathy, lumbar region: Secondary | ICD-10-CM

## 2010-10-10 MED ORDER — METHYLPREDNISOLONE ACETATE 40 MG/ML INJ SUSP (RADIOLOG
120.0000 mg | Freq: Once | INTRAMUSCULAR | Status: AC
  Administered 2010-10-10: 120 mg via EPIDURAL

## 2010-10-10 MED ORDER — IOHEXOL 180 MG/ML  SOLN
1.0000 mL | Freq: Once | INTRAMUSCULAR | Status: AC | PRN
  Administered 2010-10-10: 1 mL via EPIDURAL

## 2010-11-05 LAB — BASIC METABOLIC PANEL
BUN: 19
BUN: 20
CO2: 27
CO2: 29
Calcium: 8.3 — ABNORMAL LOW
Calcium: 8.4
Creatinine, Ser: 0.97
Creatinine, Ser: 1.04
Creatinine, Ser: 1.08
GFR calc Af Amer: 51 — ABNORMAL LOW
GFR calc Af Amer: >60
GFR calc non Af Amer: 42 — ABNORMAL LOW
GFR calc non Af Amer: >60
GFR calc non Af Amer: >60
GFR calc non Af Amer: >60
Glucose, Bld: 104 — ABNORMAL HIGH
Glucose, Bld: 108 — ABNORMAL HIGH
Glucose, Bld: 111 — ABNORMAL HIGH
Potassium: 4.1
Sodium: 134 — ABNORMAL LOW
Sodium: 135
Sodium: 136

## 2010-11-05 LAB — CBC
HCT: 37.1 — ABNORMAL LOW
Hemoglobin: 11.2 — ABNORMAL LOW
Hemoglobin: 11.6 — ABNORMAL LOW
MCHC: 34.8
MCHC: 35
Platelets: 144 — ABNORMAL LOW
Platelets: 187
Platelets: 207
RBC: 3.4 — ABNORMAL LOW
RBC: 3.8 — ABNORMAL LOW
RDW: 12.5
RDW: 12.8
RDW: 12.9
WBC: 13.3 — ABNORMAL HIGH
WBC: 8.8

## 2010-11-05 LAB — PROTIME-INR
INR: 1.1
INR: 1.4
INR: 1.5
Prothrombin Time: 14.3
Prothrombin Time: 17.2 — ABNORMAL HIGH
Prothrombin Time: 17.5 — ABNORMAL HIGH
Prothrombin Time: 18.5 — ABNORMAL HIGH
Prothrombin Time: 18.7 — ABNORMAL HIGH
Prothrombin Time: 19.6 — ABNORMAL HIGH

## 2010-11-06 LAB — CBC
HCT: 46.2
Hemoglobin: 16.1
MCV: 97.2
Platelets: 206
RDW: 12.5

## 2010-11-06 LAB — CROSSMATCH: Antibody Screen: NEGATIVE

## 2010-11-06 LAB — DIFFERENTIAL
Basophils Absolute: 0
Basophils Relative: 0
Lymphocytes Relative: 24
Monocytes Relative: 12 — ABNORMAL HIGH
Neutro Abs: 3.8
Neutrophils Relative %: 62

## 2010-11-06 LAB — URINALYSIS, ROUTINE W REFLEX MICROSCOPIC
Bilirubin Urine: NEGATIVE
Ketones, ur: NEGATIVE
Nitrite: NEGATIVE
Urobilinogen, UA: 1

## 2010-11-06 LAB — COMPREHENSIVE METABOLIC PANEL
Alkaline Phosphatase: 89
BUN: 19
Creatinine, Ser: 1.26
Glucose, Bld: 117 — ABNORMAL HIGH
Potassium: 4.2
Total Bilirubin: 1.1
Total Protein: 6.9

## 2010-11-06 LAB — APTT: aPTT: 25

## 2010-11-06 LAB — PROTIME-INR: INR: 0.9

## 2010-11-14 LAB — TYPE AND SCREEN
ABO/RH(D): O POS
Antibody Screen: NEGATIVE

## 2010-11-14 LAB — URINALYSIS, ROUTINE W REFLEX MICROSCOPIC
Bilirubin Urine: NEGATIVE
Hgb urine dipstick: NEGATIVE
Ketones, ur: NEGATIVE
Nitrite: NEGATIVE
Protein, ur: NEGATIVE
Specific Gravity, Urine: 1.028 (ref 1.005–1.035)
Urobilinogen, UA: 0.2

## 2010-11-14 LAB — DIFFERENTIAL
Basophils Absolute: 0
Eosinophils Relative: 1
Lymphocytes Relative: 29

## 2010-11-14 LAB — CBC
HCT: 44
Hemoglobin: 15.2
MCV: 98.1
RBC: 4.49
WBC: 5.1

## 2010-11-14 LAB — COMPREHENSIVE METABOLIC PANEL
AST: 50 — ABNORMAL HIGH
BUN: 20
CO2: 24
Chloride: 105
Creatinine, Ser: 0.93
GFR calc non Af Amer: >60
Glucose, Bld: 94
Total Bilirubin: 1.3 — ABNORMAL HIGH

## 2010-11-14 LAB — PROTIME-INR: Prothrombin Time: 13.1

## 2013-08-24 DIAGNOSIS — R0602 Shortness of breath: Secondary | ICD-10-CM | POA: Insufficient documentation

## 2013-12-15 ENCOUNTER — Ambulatory Visit: Payer: Self-pay | Admitting: Specialist

## 2013-12-15 LAB — BODY FLUID CELL COUNT WITH DIFFERENTIAL
BASOS ABS: 0 %
EOS PCT: 0 %
LYMPHS PCT: 66 %
NUCLEATED CELL COUNT: 1118 /mm3
Neutrophils: 6 %
OTHER CELLS BF: 0 %
Other Mononuclear Cells: 28 %

## 2013-12-15 LAB — GLUCOSE, SEROUS FLUID: Glucose, Body Fluid: 96 mg/dL

## 2013-12-15 LAB — PROTEIN, BODY FLUID: PROTEIN, BODY FLUID: 2.8 g/dL

## 2013-12-15 LAB — LACTATE DEHYDROGENASE, PLEURAL OR PERITONEAL FLUID: LDH, BODY FLUID: 110 U/L

## 2013-12-19 LAB — BODY FLUID CULTURE

## 2013-12-27 ENCOUNTER — Ambulatory Visit: Payer: Self-pay | Admitting: Internal Medicine

## 2013-12-31 ENCOUNTER — Observation Stay: Payer: Self-pay | Admitting: Internal Medicine

## 2013-12-31 LAB — COMPREHENSIVE METABOLIC PANEL
ALK PHOS: 228 U/L — AB
ANION GAP: 10 (ref 7–16)
AST: 77 U/L — AB (ref 15–37)
Albumin: 2.7 g/dL — ABNORMAL LOW (ref 3.4–5.0)
BILIRUBIN TOTAL: 1.6 mg/dL — AB (ref 0.2–1.0)
BUN: 25 mg/dL — AB (ref 7–18)
CALCIUM: 8.2 mg/dL — AB (ref 8.5–10.1)
CHLORIDE: 105 mmol/L (ref 98–107)
CO2: 20 mmol/L — AB (ref 21–32)
CREATININE: 0.88 mg/dL (ref 0.60–1.30)
EGFR (African American): >60
EGFR (Non-African Amer.): >60
GLUCOSE: 98 mg/dL (ref 65–99)
Osmolality: 274 (ref 275–301)
POTASSIUM: 5.1 mmol/L (ref 3.5–5.1)
SGPT (ALT): 34 U/L
SODIUM: 135 mmol/L — AB (ref 136–145)
TOTAL PROTEIN: 6.6 g/dL (ref 6.4–8.2)

## 2013-12-31 LAB — CBC
HCT: 33.9 % — ABNORMAL LOW (ref 40.0–52.0)
HGB: 11.1 g/dL — AB (ref 13.0–18.0)
MCH: 30.3 pg (ref 26.0–34.0)
MCHC: 32.7 g/dL (ref 32.0–36.0)
MCV: 92 fL (ref 80–100)
Platelet: 68 10*3/uL — ABNORMAL LOW (ref 150–440)
RBC: 3.67 10*6/uL — ABNORMAL LOW (ref 4.40–5.90)
RDW: 15.9 % — AB (ref 11.5–14.5)
WBC: 4.3 10*3/uL (ref 3.8–10.6)

## 2013-12-31 LAB — TROPONIN I: Troponin-I: <0.02 ng/mL

## 2014-01-01 LAB — COMPREHENSIVE METABOLIC PANEL
ALT: 26 U/L
Albumin: 2.5 g/dL — ABNORMAL LOW (ref 3.4–5.0)
Alkaline Phosphatase: 206 U/L — ABNORMAL HIGH
Anion Gap: 4 — ABNORMAL LOW (ref 7–16)
BUN: 26 mg/dL — AB (ref 7–18)
Bilirubin,Total: 1 mg/dL (ref 0.2–1.0)
CALCIUM: 7.8 mg/dL — AB (ref 8.5–10.1)
CHLORIDE: 105 mmol/L (ref 98–107)
CO2: 25 mmol/L (ref 21–32)
Creatinine: 1.03 mg/dL (ref 0.60–1.30)
EGFR (African American): >60
EGFR (Non-African Amer.): >60
Glucose: 109 mg/dL — ABNORMAL HIGH (ref 65–99)
OSMOLALITY: 274 (ref 275–301)
Potassium: 3.6 mmol/L (ref 3.5–5.1)
SGOT(AST): 34 U/L (ref 15–37)
SODIUM: 134 mmol/L — AB (ref 136–145)
TOTAL PROTEIN: 6.1 g/dL — AB (ref 6.4–8.2)

## 2014-01-01 LAB — BASIC METABOLIC PANEL
ANION GAP: 10 (ref 7–16)
BUN: 24 mg/dL — ABNORMAL HIGH (ref 7–18)
CHLORIDE: 100 mmol/L (ref 98–107)
Calcium, Total: 7.9 mg/dL — ABNORMAL LOW (ref 8.5–10.1)
Co2: 24 mmol/L (ref 21–32)
Creatinine: 1.12 mg/dL (ref 0.60–1.30)
GLUCOSE: 115 mg/dL — AB (ref 65–99)
Osmolality: 273 (ref 275–301)
Potassium: 3.6 mmol/L (ref 3.5–5.1)
Sodium: 134 mmol/L — ABNORMAL LOW (ref 136–145)

## 2014-01-01 LAB — LIPID PANEL
Cholesterol: 103 mg/dL (ref 0–200)
HDL: 43 mg/dL (ref 40–60)
Ldl Cholesterol, Calc: 41 mg/dL (ref 0–100)
Triglycerides: 95 mg/dL (ref 0–200)
VLDL CHOLESTEROL, CALC: 19 mg/dL (ref 5–40)

## 2014-01-01 LAB — CBC WITH DIFFERENTIAL/PLATELET
BASOS ABS: 0 10*3/uL (ref 0.0–0.1)
Basophil %: 0.4 %
Eosinophil #: 0 10*3/uL (ref 0.0–0.7)
Eosinophil %: 1.2 %
HCT: 32.4 % — ABNORMAL LOW (ref 40.0–52.0)
HGB: 10.6 g/dL — ABNORMAL LOW (ref 13.0–18.0)
LYMPHS ABS: 0.4 10*3/uL — AB (ref 1.0–3.6)
Lymphocyte %: 12.2 %
MCH: 30.3 pg (ref 26.0–34.0)
MCHC: 32.8 g/dL (ref 32.0–36.0)
MCV: 92 fL (ref 80–100)
MONOS PCT: 14 %
Monocyte #: 0.5 x10 3/mm (ref 0.2–1.0)
NEUTROS PCT: 72.2 %
Neutrophil #: 2.5 10*3/uL (ref 1.4–6.5)
Platelet: 68 10*3/uL — ABNORMAL LOW (ref 150–440)
RBC: 3.51 10*6/uL — AB (ref 4.40–5.90)
RDW: 16 % — AB (ref 11.5–14.5)
WBC: 3.4 10*3/uL — AB (ref 3.8–10.6)

## 2014-01-01 LAB — TSH: Thyroid Stimulating Horm: 4.04 u[IU]/mL

## 2014-01-01 LAB — MAGNESIUM: MAGNESIUM: 1.8 mg/dL

## 2014-01-02 LAB — BASIC METABOLIC PANEL
ANION GAP: 10 (ref 7–16)
BUN: 23 mg/dL — AB (ref 7–18)
CALCIUM: 7.8 mg/dL — AB (ref 8.5–10.1)
CO2: 25 mmol/L (ref 21–32)
CREATININE: 1 mg/dL (ref 0.60–1.30)
Chloride: 100 mmol/L (ref 98–107)
EGFR (Non-African Amer.): >60
GLUCOSE: 105 mg/dL — AB (ref 65–99)
Osmolality: 274 (ref 275–301)
Potassium: 3.4 mmol/L — ABNORMAL LOW (ref 3.5–5.1)
Sodium: 135 mmol/L — ABNORMAL LOW (ref 136–145)

## 2014-01-05 LAB — CULTURE, FUNGUS WITHOUT SMEAR

## 2014-01-05 LAB — CULTURE, BLOOD (SINGLE)

## 2014-01-27 ENCOUNTER — Ambulatory Visit: Payer: Self-pay | Admitting: Internal Medicine

## 2014-03-09 ENCOUNTER — Ambulatory Visit: Payer: Self-pay | Admitting: Gastroenterology

## 2014-03-26 DIAGNOSIS — R188 Other ascites: Secondary | ICD-10-CM | POA: Insufficient documentation

## 2014-03-26 DIAGNOSIS — K7460 Unspecified cirrhosis of liver: Secondary | ICD-10-CM | POA: Insufficient documentation

## 2014-03-28 ENCOUNTER — Ambulatory Visit
Admit: 2014-03-28 | Disposition: A | Discharge status: Home / Self Care | Payer: Self-pay | Attending: Gastroenterology | Admitting: Gastroenterology

## 2014-03-29 ENCOUNTER — Ambulatory Visit: Payer: Self-pay | Admitting: Gastroenterology

## 2014-05-20 NOTE — Consult Note (Signed)
   Comments   Pt is being discharged and I will not be able to meet with pt's children.  Spoke with pt and will make him a DNR.  Showed him out of facility DNR that will be with his discharge paperwork.  Pt A/O x 3 and understands this decision and states that he will speak to his children about his wishes.  Pt understands he could reverse this decision if he wanted to.  Electronic Signatures: Samvel Zinn, Judith Part (NP)  (Signed 07-Dec-15 14:57)  Authored: Palliative Care Phifer, Izora Gala (MD)  (Signed 07-Dec-15 16:44)  Authored: Palliative Care   Last Updated: 07-Dec-15 16:44 by Phifer, Izora Gala (MD)

## 2014-05-20 NOTE — H&P (Signed)
PATIENT NAME:  Sean Jensen, Sean Jensen MR#:  [id number] DATE OF BIRTH:  1921-05-23  DATE OF ADMISSION:  12/31/2013   PRIMARY CARE PROVIDER:  [place] Pink, MD   EMERGENCY DEPARTMENT REFERRING PHYSICIAN: Lavonia Drafts, MD   CHIEF COMPLAINT: Shortness of breath, right-sided chest pain.   HISTORY OF PRESENT ILLNESS:  The patient is a 81 year old white male, who developed shortness of breath about 2 months ago.  At that time, he was in [place].  There, he was seen at an urgent care and at that time he was diagnosed possible pneumonia, given some antibiotics and some Lasix.  Also noticed to have pleural effusion.  The patient was subsequently given those treatments and improvement in his symptoms; however 2 weeks ago, shortness of breath got worse and then he had a repeat chest x-ray, was seen by Dr. Raul Del, had an outpatient thoracentesis done on 11/19, had 1.5 liters of fluid removal. The fluid was transudative in nature. He reports that he was feeling better for a few days but then he started having swelling in the lower extremity and then started having shortness of breath and now has progressively gotten worse.  The patient came to the ED, had a chest x-ray which shows reaccumulation of the right side of the effusion, associated right middle and lower lobe collapse.  The patient otherwise also complains of sharp right-sided chest pain, but does have a dry cough, but no wheezing. Denies any nausea, vomiting or diarrhea. Denies any urinary symptoms. He denies any weight loss. C  PAST MEDICAL HISTORY:  Hyperlipidemia, history of atrial fibrillation, coronary artery disease.   PAST SURGICAL HISTORY: Status post coronary artery bypass graft, had C-spine surgery and right knee surgery.   ALLERGIES: None.   MEDICATIONS AT HOME:  He is on vitamin C 1000 mg daily, Tylenol p.r.n., multivitamin daily, gabapentin 300 daily, Lasix 20 mg 1 tab p.o. daily, fluticasone 50 mcg 1 puff daily, fexofenadine 180 daily,  doxazosin 4 mg daily, cyanocobalamin 1000 mcg daily, Crestor 5 daily.   SOCIAL HISTORY: Does not smoke. Does not drink. No drugs.   FAMILY HISTORY: Positive for hypertension.   REVIEW OF SYSTEMS:  CONSTITUTIONAL: Denies some fatigue, weakness. No weight loss or weight gain.  HEENT: Denies any blurred vision, double vision, no redness of the eyes, denies any nasal drainage or congestion. No epistaxis. No seasonal or year-round allergies. Has chronic hearing difficulty. He has hearing aids. Denies any difficulty with swallowing.  CARDIOVASCULAR: Denies any left-sided chest pain. No history of coronary artery disease with no with CABG, no syncope.  Complains of dyspnea on exertion. No history of possible atrial fibrillation.  PULMONARY: Complains of cough, dyspnea on exertion. No wheezing. No hemoptysis. No chronic obstructive pulmonary disease, no tuberculosis.  GASTROINTESTINAL: Denies any nausea, vomiting, diarrhea. No abdominal pain. No hematemesis. No hematochezia.  GENITOURINARY: Denies any frequency, urgency or hesitancy.  SKIN: Denies any rash.  LYMPHATICS: Denies any lymph node enlargement.  VASCULAR: Denies any claudication symptoms.  NEUROLOGIC: Denies CVA, transient ischemic attack or seizures.  PSYCHIATRIC: No anxiety, insomnia, or ADD.   PHYSICAL EXAMINATION:  VITAL SIGNS: Temperature 98.5, pulse 58, respirations 18, blood pressure 131/77, O2 of 93%.  GENERAL: The patient is a well-developed, well-nourished male in no acute distress.  HEENT: Head atraumatic, normocephalic. Pupils equally round, reactive to light and accommodation. There is no conjunctiva pallor.  No sclerae icterus.  Nasal exam shows no drainage or ulceration.  OROPHARYNX: Clear without any exudate.  NECK: Supple without  any JVD. No thyromegaly.  CARDIOVASCULAR: Regular rate and rhythm. No murmurs, rubs, clicks, or gallops. PMI is not displaced.   LUNGS: Diminished breath sounds in the right lung base. There is  no wheezing or rhonchi.  ABDOMEN: Soft, nontender, distended with some fluid wave. Positive bowel sounds x 4. No hepatosplenomegaly.  EXTREMITIES: He has 2+ edema.  LYMPH NODES: Nonpalpable.  VASCULAR: Good DP pulses.   PSYCHIATRIC:  Not anxious or depressed.  NEUROLOGIC: Awake, alert, and oriented x 3. No focal deficits.  LYMPH NODES: Nonpalpable.   LABORATORY, DIAGNOSTIC AND RADIOLOGICAL DATA:  Glucose 98, BUN 25, creatinine 0.88, sodium 135, potassium 5.1, chloride 105, CO2 is 20, calcium is 8.2.  LFTs: Total protein 6.6, albumin 2.7, bilirubin total 1.6, alkaline phosphatase 228, AST 77, ALT 34, troponin less than 0.02. WBC 4.3, hemoglobin 11.1, platelet count is 68,000.  Chest x-ray shows enlarging right effusion with right middle and lower lobe collapse.      ASSESSMENT AND PLAN: The patient is a 81 year old with history of transudative effusion in the past presents with right-sided chest pain, shortness of breath.  1.  Recurrent pleural effusion which was transudative based on previous evaluation. Differential diagnoses includes possible cardiac cause, as well as possible liver cirrhosis as a cause of the ascites. With the low platelet count, there is concern that he may have some liver disease.  At this time, we will get an ultrasound of the abdomen to further evaluate for ascites and liver cirrhosis.  Also get an echocardiogram of the heart to rule out cardiac dysfunction causing pleural effusion. We will obtain thoracentesis on Monday.  2.  Atrial fibrillation, heart rate stable, no anticoagulation for possible thoracocentesis.  The patient is not a good candidate for long-term anticoagulation.  3.  Hyperlipidemia.  We will hold cholesterol medication in light of possible underlying liver disease.  4.  Coronary artery disease. Hold aspirin.  5.  Miscellaneous. The patient will be on TED hose for deep vein thrombosis prophylaxis.   CODE STATUS: Discussed with the patient and family, the  son is at the bedside.  He reports that they will discuss further regarding this.  Currently, the patient will be FULL CODE.    TIME SPENT ON THIS PATIENT: 50 minutes.     ____________________________ Lafonda Mosses Posey Pronto, MD shp:DT D: 12/31/2013 15:28:42 ET T: 12/31/2013 15:42:04 ET JOB#: [id number]  cc: Emarie Paul H. Posey Pronto, MD, <Dictator> Alric Seton MD ELECTRONICALLY SIGNED 01/06/2014 17:10

## 2014-05-20 NOTE — Consult Note (Signed)
Brief Consult Note: Diagnosis: cirrhosis, pleural effusion.   Patient was seen by consultant.   Consult note dictated.   Recommend further assessment or treatment.   Comments: 1.) Cirrhosis  2.) Pleural Effusion:  too early to conclude this is a hepatic hydrothorax.    Evidence of lung collapse / possibile mass on CXR.   - recommend CT chest right after drainage to eval for underlying cause.  Electronic Signatures: Arther Dames (MD)  (Signed 06-Dec-15 16:40)  Authored: Brief Consult Note   Last Updated: 06-Dec-15 16:40 by Arther Dames (MD)

## 2014-05-20 NOTE — Discharge Summary (Signed)
PATIENT NAME:  Sean Jensen, Sean Jensen MR#:  [id number] DATE OF BIRTH:  1922/01/15  DATE OF ADMISSION:  12/31/2013 DATE OF DISCHARGE:  01/02/2014  ADMITTING DIAGNOSIS: Shortness of breath.   DISCHARGE DIAGNOSES:  1.  Shortness of breath due to right-sided pleural effusion.  2.  Right-sided pleural effusion due to new diagnosis of liver cirrhosis.  3.  Liver cirrhosis, likely related to  nonhepatic fatty steatosis with negative hepatitis C and B.  4.  Thyroid mass noted on CT scan, needs outpatient endocrinology followup.  5.  Hyperlipidemia.  6.  History of atrial fibrillation.  7.  Coronary artery disease.  8.  Status post coronary artery bypass graft, status post C-spine surgery, right knee surgery.   CONSULTANTS: GI.   PERTINENT LABORATORIES AND EVALUATIONS: Admitting glucose 98, BUN 25, creatinine 0.88, sodium 135, potassium 5.1, chloride 105, CO2 was 20, calcium 8.2. LFTs: Total protein 6.6, albumin 2.7, bilirubin total 1.6, alkaline phosphatase 228. Troponin was less than 0.02. WBC 4.3, hemoglobin 11.1, platelet count was 68,000. Chest x-ray shows enlarging right effusion with right middle and lower lobe collapse. Ultrasound of his abdomen showed ascites and findings consistent with liver cirrhosis. CT of the chest showed cirrhosis with splenomegaly, varices and upper abdominal ascites, dominance 6.5 cm low-density thyroid nodule.   HOSPITAL COURSE: Please refer to H and P done by me on admission. The patient is a 81 year old white male who was recently diagnosed with pleural effusion who underwent thoracentesis, but the etiology of this was unclear. It was transudative in nature. He came back to the ED with shortness of breath. 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 His hepatitis panel was negative. There is no history of alcohol abuse. The patient underwent thoracentesis for symptomatic  shortness of breath. The patient had this procedure and had 1.4 liters of fluid removed. His symptoms have now resolved. He is doing much better and is stable for discharge. He was seen by GI who recommended a CT of the chest, did not been entirely feel that this was related to liver. CT scan did confirm cause of his pleural effusion to be liver cirrhosis. The patient also was noted to have a thyroid nodule, which he needs outpatient followup. He was seen by palliative care and made DNR.   DISCHARGE MEDICATIONS: Crestor 5 mg daily, doxazosin 4 mg daily, fexofenadine 180 daily, multivitamin 1 tablet p.o. daily, vitamin C 1000 mg daily, Tylenol p.r.n. recommend not to exceed 2000 grams, cyanocobalamin 1000 mcg daily, gabapentin 300 daily, Lasix 40 daily, fluticasone 50 mcg 1 spray daily to each nostril, spironolactone 100 daily, oxycodone 5 mg q. 4 p.r.n.   DIET: Low-sodium, low-fat, low-cholesterol.   ACTIVITY: As tolerated.   FOLLOWUP: With primary M.D. in 1-2 weeks. Dr. Gorden Harms needs to arrange for paracentesis. The patient will likely need elective paracentesis to prevent recurrent symptoms of ascites and pleural effusion. Follow with [facility] in 2-4 weeks. Follow with Dr. Elisabeth Cara in 2-4 weeks for thyroid mass. Recommend a therapeutic paracentesis.  TIME SPENT: Thirty-five minutes spent on the discharge.   ____________________________ Lafonda Mosses. Posey Pronto, MD shp:TT D: 01/03/2014 15:11:32 ET T: 01/03/2014 20:49:33 ET JOB#: [id number]  cc: Jailee Jaquez H. Posey Pronto, MD, <Dictator> Alric Seton MD ELECTRONICALLY SIGNED 01/06/2014 17:11

## 2014-05-24 NOTE — Consult Note (Signed)
PATIENT NAME:  Sean Jensen, MORRISS MR#:  [id number] DATE OF BIRTH:  1921-07-04  DATE OF CONSULTATION:  01/01/2014  REFERRING PHYSICIAN:  Shreyang H. Posey Pronto, MD CONSULTING PHYSICIAN:  Arther Dames, MD  REASON FOR CONSULTATION: Cirrhosis, pleural effusion.  HISTORY OF PRESENT ILLNESS: Sean Jensen is a 81 year old male with a past medical history notable for AFib, coronary artery disease who is presenting to the Emergency Room for evaluation of shortness of breath and right-sided chest pain. Sean Jensen also had a similar presentation approximately 2 weeks ago, at which time Sean Jensen had a thoracentesis done on a pleural effusion that was transudative with 1.5 L of straw-colored fluid removed.  Sean Jensen felt better after that procedure, but now the shortness of breath and chest pain have since returned.  In the setting of working this up, on an old liver ultrasound it does appear as though Sean Jensen has cirrhosis of the liver with ascites.  Mr. Sean Jensen is previously unaware of any trouble with his liver. Sean Jensen does not have any family history of liver disease and was not a previous heavy drinker of alcohol. Sean Jensen was also not a smoker and is not having any trouble with cough or hemoptysis.  PAST MEDICAL HISTORY: 1.  Hyperlipidemia. 2.  AFib. 3.  Coronary artery disease. 4.  Cirrhosis.  PAST SURGICAL HISTORY: CABG, C-spine surgery, right knee surgery.  ALLERGIES: None.  HOME MEDICATIONS: Vitamin C, Tylenol, multivitamin, gabapentin, Lasix 20 daily, fluticasone, fexofenadine, doxazosin, B12, and Crestor.   SOCIAL HISTORY: Sean Jensen has never been a smoker or a drinker.  FAMILY HISTORY: No family history of liver disease. No family history of GI malignancy.  REVIEW OF SYSTEMS: A 10-system review was conducted and is negative except as stated in the HPI.  PHYSICAL EXAMINATION: VITAL SIGNS: Currently his temperature is 97.7, pulse is 56, respirations are 18, blood pressure 123/68. Pulse is 93% on room air. GENERAL: Alert and oriented  times 4.  No acute distress. Appears stated age. HEENT: Normocephalic/atraumatic. Extraocular movements are intact. Anicteric. NECK: Soft, supple. JVP appears normal. No adenopathy. CHEST: Decreased breath sounds in the right lower lung field, bilateral coarse breath sounds. No wheezes or crackles. HEART: Regular. No murmur, rub, or gallop.  Normal S1 and S2. ABDOMEN: Some mild distention, no taught. Normal active bowel sounds, soft. No rebound or guarding. LOWER EXTREMITIES: 2+ pitting edema bilateral. No cyanosis. SKIN: No rash or lesion. Skin color, texture, turgor normal. NEUROLOGICAL: Grossly intact. PSYCHIATRIC: Normal tone and affect. MUSCULOSKELETAL: No joint swelling or erythema.   LABORATORY DATA: The sodium is 134, potassium 3.6, BUN 26, creatinine 1.03. The albumin 2.5, total bili 1.0, alkaline phosphatase is 206, AST is 34, ALT is 26. The white count is 3.4, hemoglobin is 10.6, hematocrit is 32, platelets are 68,000.   DIAGNOSTIC DATA: Sean Jensen did have a cardiac echo, which showed an EF of greater than 65% to 70%. Sean Jensen had a chest x-ray, which showed a large pleural effusion. Sean Jensen did have a previous chest x-ray back on November 19 that did show underlying pulmonary opacity in the right lower lung that may represent residual atelectasis, infiltrate, or mass.   ASSESSMENT AND PLAN: 1.  Right pleural effusion: While this could turn out to be a hepatic hydrothorax, it will be very important to rule out other causes of this such as congestive heart failure, malignancy, infection. If it is hepatic hydrothorax in this 81 year old, it is going to be extremely difficult to treat. I would recommend obtaining a CT chest after the  thoracentesis to ensure that we are not missing something in that lung field that may be more potentially treatable than a right hepatic hydrothorax. If it does turn out to be an hepatic hydrothorax, I would initially try very low-salt diet and aggressive diuretics if his kidneys  will tolerate. If this fails, the options will likely be a PleurX catheter, which has its own very severe infection risks. I doubt that Sean Jensen would be a candidate for transjugular intrahepatic portosystemic shunt (TIPS) given his age. 2.  Cirrhosis: The likely etiology is fatty liver disease. I would recommend a very low-salt and initiating diuretics of Lasix 40 mg daily and spironolactone 100 mg daily. Sean Jensen will need very close monitoring of his lytes and his renal function on that regimen.    ____________________________ Arther Dames, MD mr:sw D: 01/01/2014 16:50:00 ET T: 01/01/2014 18:10:59 ET JOB#: [id number]  cc: Arther Dames, MD, <Dictator> Mellody Life MD ELECTRONICALLY SIGNED 01/30/2014 14:16

## 2014-12-04 ENCOUNTER — Other Ambulatory Visit: Payer: Self-pay | Admitting: Family Medicine

## 2014-12-04 DIAGNOSIS — N63 Unspecified lump in unspecified breast: Secondary | ICD-10-CM

## 2014-12-04 DIAGNOSIS — N644 Mastodynia: Secondary | ICD-10-CM

## 2014-12-20 ENCOUNTER — Ambulatory Visit
Admission: RE | Admit: 2014-12-20 | Discharge: 2014-12-20 | Disposition: A | Discharge status: Home / Self Care | Payer: Medicare Other | Source: Ambulatory Visit | Attending: Family Medicine | Admitting: Family Medicine

## 2014-12-20 DIAGNOSIS — N644 Mastodynia: Secondary | ICD-10-CM

## 2014-12-20 DIAGNOSIS — N63 Unspecified lump in unspecified breast: Secondary | ICD-10-CM

## 2015-11-27 ENCOUNTER — Other Ambulatory Visit: Payer: Self-pay | Admitting: Student

## 2015-11-27 DIAGNOSIS — M7989 Other specified soft tissue disorders: Secondary | ICD-10-CM

## 2015-11-28 ENCOUNTER — Ambulatory Visit
Admission: RE | Admit: 2015-11-28 | Discharge: 2015-11-28 | Disposition: A | Discharge status: Home / Self Care | Payer: Medicare Other | Source: Ambulatory Visit | Attending: Student | Admitting: Student

## 2015-11-28 DIAGNOSIS — M7989 Other specified soft tissue disorders: Secondary | ICD-10-CM | POA: Insufficient documentation

## 2015-11-28 DIAGNOSIS — M7522 Bicipital tendinitis, left shoulder: Secondary | ICD-10-CM | POA: Diagnosis not present

## 2015-12-11 ENCOUNTER — Other Ambulatory Visit: Payer: Self-pay | Admitting: Family Medicine

## 2015-12-11 DIAGNOSIS — R140 Abdominal distension (gaseous): Secondary | ICD-10-CM

## 2015-12-12 DIAGNOSIS — I5033 Acute on chronic diastolic (congestive) heart failure: Secondary | ICD-10-CM | POA: Insufficient documentation

## 2015-12-13 ENCOUNTER — Encounter: Payer: Self-pay | Admitting: Emergency Medicine

## 2015-12-13 ENCOUNTER — Emergency Department
Admission: EM | Admit: 2015-12-13 | Discharge: 2015-12-13 | Disposition: A | Discharge status: Home / Self Care | Payer: Medicare Other | Attending: Emergency Medicine | Admitting: Emergency Medicine

## 2015-12-13 ENCOUNTER — Ambulatory Visit
Admission: RE | Admit: 2015-12-13 | Discharge: 2015-12-13 | Disposition: A | Discharge status: Home / Self Care | Payer: Medicare Other | Source: Ambulatory Visit | Attending: Family Medicine | Admitting: Family Medicine

## 2015-12-13 DIAGNOSIS — Z85828 Personal history of other malignant neoplasm of skin: Secondary | ICD-10-CM | POA: Diagnosis not present

## 2015-12-13 DIAGNOSIS — R140 Abdominal distension (gaseous): Secondary | ICD-10-CM | POA: Insufficient documentation

## 2015-12-13 DIAGNOSIS — I509 Heart failure, unspecified: Secondary | ICD-10-CM | POA: Diagnosis not present

## 2015-12-13 DIAGNOSIS — R188 Other ascites: Secondary | ICD-10-CM

## 2015-12-13 DIAGNOSIS — J90 Pleural effusion, not elsewhere classified: Secondary | ICD-10-CM | POA: Insufficient documentation

## 2015-12-13 DIAGNOSIS — N281 Cyst of kidney, acquired: Secondary | ICD-10-CM | POA: Insufficient documentation

## 2015-12-13 DIAGNOSIS — R160 Hepatomegaly, not elsewhere classified: Secondary | ICD-10-CM

## 2015-12-13 DIAGNOSIS — I701 Atherosclerosis of renal artery: Secondary | ICD-10-CM | POA: Insufficient documentation

## 2015-12-13 DIAGNOSIS — K7469 Other cirrhosis of liver: Secondary | ICD-10-CM | POA: Diagnosis not present

## 2015-12-13 DIAGNOSIS — K8000 Calculus of gallbladder with acute cholecystitis without obstruction: Secondary | ICD-10-CM

## 2015-12-13 DIAGNOSIS — K8020 Calculus of gallbladder without cholecystitis without obstruction: Secondary | ICD-10-CM | POA: Diagnosis not present

## 2015-12-13 DIAGNOSIS — R161 Splenomegaly, not elsewhere classified: Secondary | ICD-10-CM | POA: Insufficient documentation

## 2015-12-13 HISTORY — DX: Heart failure, unspecified: I50.9

## 2015-12-13 HISTORY — DX: Malignant (primary) neoplasm, unspecified: C80.1

## 2015-12-13 HISTORY — DX: Unspecified cirrhosis of liver: K74.60

## 2015-12-13 LAB — COMPREHENSIVE METABOLIC PANEL
ALT: 39 U/L (ref 17–63)
AST: 64 U/L — AB (ref 15–41)
Albumin: 2.8 g/dL — ABNORMAL LOW (ref 3.5–5.0)
Alkaline Phosphatase: 385 U/L — ABNORMAL HIGH (ref 38–126)
Anion gap: 8 (ref 5–15)
BILIRUBIN TOTAL: 1.2 mg/dL (ref 0.3–1.2)
BUN: 21 mg/dL — AB (ref 6–20)
CHLORIDE: 100 mmol/L — AB (ref 101–111)
CO2: 23 mmol/L (ref 22–32)
CREATININE: 0.8 mg/dL (ref 0.61–1.24)
Calcium: 8.4 mg/dL — ABNORMAL LOW (ref 8.9–10.3)
GFR calc Af Amer: >60 mL/min (ref >60)
Glucose, Bld: 108 mg/dL — ABNORMAL HIGH (ref 65–99)
Potassium: 3.9 mmol/L (ref 3.5–5.1)
Sodium: 131 mmol/L — ABNORMAL LOW (ref 135–145)
Total Protein: 6.1 g/dL — ABNORMAL LOW (ref 6.5–8.1)

## 2015-12-13 LAB — CBC
HEMATOCRIT: 32.9 % — AB (ref 40.0–52.0)
HEMOGLOBIN: 11 g/dL — AB (ref 13.0–18.0)
MCH: 30.8 pg (ref 26.0–34.0)
MCHC: 33.4 g/dL (ref 32.0–36.0)
MCV: 92.1 fL (ref 80.0–100.0)
Platelets: 79 10*3/uL — ABNORMAL LOW (ref 150–440)
RBC: 3.57 MIL/uL — AB (ref 4.40–5.90)
RDW: 14.6 % — ABNORMAL HIGH (ref 11.5–14.5)
WBC: 3.2 10*3/uL — AB (ref 3.8–10.6)

## 2015-12-13 LAB — LIPASE, BLOOD: LIPASE: 28 U/L (ref 11–51)

## 2015-12-13 NOTE — ED Triage Notes (Signed)
Patient presents to the ED because the PCP called them after reading patient's [place] results and seeing a problem with gallstones.  Patient states he hasn't been feeling well for several weeks and has had abdominal distention and bloating with a distended bellybutton for several weeks.  Patient has history of liver cirrhosis.  Patient denies abdominal pain at this time. Patient is alert and very pleasant.  Patient is hard of hearing.

## 2015-12-13 NOTE — ED Provider Notes (Addendum)
[facility] Provider Note   CSN: [id number] Arrival date & time: 12/13/15  1744     History   Chief Complaint Chief Complaint  Patient presents with  . Bloated    HPI Sean Jensen. is a 82 y.o. male hx of skin cancer, CHF, known cirrhosis Here presenting with abdominal distention, possible acute cholecystitis on [place]. Patient was diagnosed with cirrhosis with ascites. Has seen GI by year ago and had paracentesis. Patient has been having progressive abdominal distention and swelling for the last several weeks. He has abdominal hernia that is more protruding so saw primary care doctor several days ago and ultrasound was ordered. He had an ultrasound done today that showed ascites. Ultrasound also showed stone in the gallbladder neck and possible acute cholecystitis. Denies any fever or vomiting or RUQ pain.    The history is provided by the patient.    Past Medical History:  Diagnosis Date  . Cancer ([place])    skin cancer  . CHF (congestive heart failure) ([place])   . Cirrhosis ([place])     There are no active problems to display for this patient.   Past Surgical History:  Procedure Laterality Date  . cardiac bypass    . CARDIAC SURGERY     quadruple bypass  . JOINT REPLACEMENT Right    knee       Home Medications    Prior to Admission medications   Not on File    Family History No family history on file.  Social History Social History  Substance Use Topics  . Smoking status: Never Smoker  . Smokeless tobacco: Never Used  . Alcohol use No     Allergies   Hydrocodone   Review of Systems Review of Systems  Gastrointestinal: Positive for abdominal distention.  All other systems reviewed and are negative.    Physical Exam Updated Vital Signs BP 137/61   Pulse 85   Temp 98.2 F (36.8 C) (Oral)   Resp 14   Ht '5\' 6"'$  (1.676 m)   Wt 183 lb (83 kg)   SpO2 96%   BMI 29.54 kg/m   Physical Exam  Constitutional:  Chronically ill,     HENT:  Head: Normocephalic.  Mouth/Throat: Oropharynx is clear and moist.  Eyes: EOM are normal. Pupils are equal, round, and reactive to light.  Neck: Normal range of motion. Neck supple.  Cardiovascular: Normal rate, regular rhythm and normal heart sounds.   Pulmonary/Chest: Effort normal and breath sounds normal.  Abdominal:  Distended, + fluid wave. No RUQ tenderness. Umbilical hernia that is reducible.   Musculoskeletal: Normal range of motion.  Neurological: He is alert.  Demented, Nl strength throughout   Skin: Skin is warm.  Psychiatric: He has a normal mood and affect.  Nursing note and vitals reviewed.    ED Treatments / Results  Labs (all labs ordered are listed, but only abnormal results are displayed) Labs Reviewed  CBC - Abnormal; Notable for the following:       Result Value   WBC 3.2 (*)    RBC 3.57 (*)    Hemoglobin 11.0 (*)    HCT 32.9 (*)    RDW 14.6 (*)    Platelets 79 (*)    All other components within normal limits  COMPREHENSIVE METABOLIC PANEL - Abnormal; Notable for the following:    Sodium 131 (*)    Chloride 100 (*)    Glucose, Bld 108 (*)    BUN 21 (*)  Calcium 8.4 (*)    Total Protein 6.1 (*)    Albumin 2.8 (*)    AST 64 (*)    Alkaline Phosphatase 385 (*)    All other components within normal limits  LIPASE, BLOOD    EKG  EKG Interpretation None       Radiology US Abdomen Complete  Result Date: 12/13/2015 CLINICAL DATA:  Abdominal distention for 1 month EXAM: ABDOMEN ULTRASOUND COMPLETE COMPARISON:  December 31, 2013 FINDINGS: Gallbladder: There is a 9 mm calculus adherent in the neck of the gallbladder. Gallbladder wall is thickened and edematous in appearance. There is pericholecystic fluid which may be associated with nearby ascites. No sonographic Murphy sign noted by sonographer. Common bile duct: Diameter: 3 mm. There is no intrahepatic, common hepatic, common bile duct dilatation demonstrated on this study Liver: There is  a uniformly echogenic mass in the anterior segment of the right lobe of the liver measuring 1.4 x 1.2 x 1.5 cm, not seen on prior study. The liver is small with a nodular contour and diffuse coarsened echotexture consistent with underlying cirrhosis. IVC: No abnormality visualized in visualized portions. Portions of the inferior vena cava are obscured by gas. Pancreas: Visualized portion unremarkable. Portions of the pancreas are obscured by gas. Spleen: Spleen measures 15.8 x 13.0 x 6.7 cm with a measured splenic volume of 722 cubic cm. There is a small accessory spleen nearby. Right Kidney: Length: 9.3 cm. Echogenicity within normal limits. No hydronephrosis visualized. There is a right upper pole renal cyst measuring 3.2 x 2.3 x 2.5 cm Left Kidney: Length: 11.9 cm. Echogenicity within normal limits. No hydronephrosis visualized. There are small parapelvic cysts, largest measuring just over 1 cm. Abdominal aorta: No aneurysm visualized. Other findings: There is moderate ascites. There is a right pleural effusion. IMPRESSION: Moderate ascites.  Right pleural effusion. 9 mm calculus adherent in the neck of the gallbladder. Gallbladder wall is thickened and appears somewhat edematous. This appearance may be seen with associated ascites. A degree of acute cholecystitis must be of concern given this appearance, however, particularly given the calculus adherent in the neck of the gallbladder. Hepatic cirrhosis. New uniformly echogenic mass seen in the right lobe of the liver measuring 1.4 x 1.5 cm. This lesion potentially may represent a hemangioma. As it is not seen previously, however, further assessment is felt to be warranted to exclude possible early hepatic cellular carcinoma. In this regard, pre and multiphasic post-contrast CT or MR of the liver advised. Portions of inferior vena cava and pancreas are obscured by gas. Visualized portions of the structures appear normal. Splenomegaly. Left kidney is larger than  right kidney. This finding raises concern for potential renal artery stenosis on the right. In this regard, question whether patient is hypertensive. These results will be called to the ordering clinician or representative by the Radiologist Assistant, and communication documented in the PACS or zVision Dashboard. Electronically Signed   By: Lowella Grip III M.D.   On: 12/13/2015 15:25    Procedures Procedures (including critical care time)  Medications Ordered in ED Medications - No data to display   Initial Impression / Assessment and Plan / ED Course  I have reviewed the triage vital signs and the nursing notes.  Pertinent labs & imaging results that were available during my care of the patient were reviewed by me and considered in my medical decision making (see chart for details).  Clinical Course     [facility] Tison Leibold. is a 82  y.o. male here with abdominal distention, possible acute chole. I reviewed [place] images. He has gallstones in the neck. However, has ascites so wall may be thickened from ascites. No RUQ tenderness. Will get LFTs and consult surgery. Will do paracentesis at bedside. Not concerned for SBP.   8:48 PM LFTs stable. Alk Phos 385 but was 200 previously. Not hypoxic here. I called Dr. Dahlia Byes, who reviewed the case. He states that since he has no RUQ tenderness and no signs of acute chole, the thickened gallbladder wall likely from cirrhosis. He states that patient will need referral to GI and tertiary care center for operation if he choose to have it taken out as he is not comfortable operating on this patient. He is not hypoxic and doesn't need emergent paracentesis. Will refer to GI on call, Dr. Allen Norris and have PCP refer to [facility] surgery.   Final Clinical Impressions(s) / ED Diagnoses   Final diagnoses:  None    New Prescriptions New Prescriptions   No medications on file     Drenda Freeze, MD 12/13/15 2050    Drenda Freeze, MD 12/13/15 2051

## 2015-12-13 NOTE — Discharge Instructions (Signed)
You have cirrhosis. You will need to see Dr. Allen Norris or another GI doctor to coordinate to get paracentesis.   Your doctor needs to refer to GI surgeon at [facility] at our doctors are not comfortable operating on you.   Return to ER if you have worse shortness of breath, abdominal pain and distention, vomiting, fever.

## 2015-12-13 NOTE — ED Notes (Signed)
Blood recollected and sent to lab

## 2015-12-18 ENCOUNTER — Other Ambulatory Visit: Payer: Self-pay

## 2015-12-18 ENCOUNTER — Ambulatory Visit (INDEPENDENT_AMBULATORY_CARE_PROVIDER_SITE_OTHER): Payer: Medicare Other | Admitting: Gastroenterology

## 2015-12-18 ENCOUNTER — Other Ambulatory Visit: Payer: Self-pay | Admitting: Internal Medicine

## 2015-12-18 ENCOUNTER — Other Ambulatory Visit
Admission: RE | Admit: 2015-12-18 | Discharge: 2015-12-18 | Disposition: A | Discharge status: Home / Self Care | Payer: Medicare Other | Source: Ambulatory Visit | Attending: Gastroenterology | Admitting: Gastroenterology

## 2015-12-18 ENCOUNTER — Encounter: Payer: Self-pay | Admitting: Gastroenterology

## 2015-12-18 VITALS — BP 131/60 | HR 54 | Temp 98.7°F | Ht 66.0 in | Wt 184.2 lb

## 2015-12-18 DIAGNOSIS — K7460 Unspecified cirrhosis of liver: Secondary | ICD-10-CM

## 2015-12-18 DIAGNOSIS — M1990 Unspecified osteoarthritis, unspecified site: Secondary | ICD-10-CM | POA: Insufficient documentation

## 2015-12-18 DIAGNOSIS — I2510 Atherosclerotic heart disease of native coronary artery without angina pectoris: Secondary | ICD-10-CM | POA: Insufficient documentation

## 2015-12-18 DIAGNOSIS — I10 Essential (primary) hypertension: Secondary | ICD-10-CM | POA: Insufficient documentation

## 2015-12-18 DIAGNOSIS — I509 Heart failure, unspecified: Secondary | ICD-10-CM

## 2015-12-18 DIAGNOSIS — R0602 Shortness of breath: Secondary | ICD-10-CM

## 2015-12-18 DIAGNOSIS — E782 Mixed hyperlipidemia: Secondary | ICD-10-CM | POA: Insufficient documentation

## 2015-12-18 DIAGNOSIS — R188 Other ascites: Principal | ICD-10-CM

## 2015-12-18 LAB — COMPREHENSIVE METABOLIC PANEL
ALBUMIN: 3.1 g/dL — AB (ref 3.5–5.0)
ALK PHOS: 420 U/L — AB (ref 38–126)
ALT: 38 U/L (ref 17–63)
AST: 55 U/L — AB (ref 15–41)
Anion gap: 8 (ref 5–15)
BILIRUBIN TOTAL: 1.4 mg/dL — AB (ref 0.3–1.2)
BUN: 21 mg/dL — AB (ref 6–20)
CALCIUM: 8.8 mg/dL — AB (ref 8.9–10.3)
CO2: 24 mmol/L (ref 22–32)
CREATININE: 0.86 mg/dL (ref 0.61–1.24)
Chloride: 100 mmol/L — ABNORMAL LOW (ref 101–111)
GFR calc Af Amer: >60 mL/min (ref >60)
GLUCOSE: 111 mg/dL — AB (ref 65–99)
Potassium: 4.9 mmol/L (ref 3.5–5.1)
Sodium: 132 mmol/L — ABNORMAL LOW (ref 135–145)
TOTAL PROTEIN: 6.8 g/dL (ref 6.5–8.1)

## 2015-12-18 MED ORDER — FUROSEMIDE 40 MG PO TABS: 40.0000 mg | ORAL_TABLET | Freq: Every day | ORAL | 0 refills | Status: DC

## 2015-12-18 NOTE — Progress Notes (Signed)
Gastroenterology Consultation  Referring Provider:     Maryland Pink, MD Primary Care Physician:  Maryland Pink, MD Primary Gastroenterologist:  Dr. Allen Norris     Reason for Consultation:     Abnormal ultrasound        HPI:   Sean Jensen. is a 82 y.o. y/o male referred for consultation & management of Abnormal ultrasound by Dr. Maryland Pink, MD.  This patient comes in today with a history of cirrhosis and ascites. The patient was found on ultrasound to have a 9 mm calculus in the neck of the gallbladder. The patient states he is asymptomatic. The gallbladder also showed wall thickening consistent with his ptosis and ascites. The patient also had a new echogenic mass seen in the right lobe of the liver. Was recommended that this patient may have a hemangioma although a hepatocele carcinoma could not be excluded. The patient is mainly concerned about his ascites with abdominal distention and umbilical hernia filled with acidic fluid. He also reports that his lower extremities are also swelling. There is no report of any abdominal pain nausea vomiting fevers chills or unexplained weight loss. There is also no report of any black stools or bloody stools.  Past Medical History:  Diagnosis Date  . Cancer ([place])    skin cancer  . CHF (congestive heart failure) ([place])   . Cirrhosis [facility])     Past Surgical History:  Procedure Laterality Date  . cardiac bypass    . CARDIAC SURGERY     quadruple bypass  . JOINT REPLACEMENT Right    knee    Prior to Admission medications   Medication Sig Start Date End Date Taking? Authorizing Provider  acetaminophen (TYLENOL) 650 MG CR tablet Take by mouth.   Yes Historical Provider, MD  Ascorbic Acid (VITAMIN C) 1000 MG tablet Take by mouth.   Yes Historical Provider, MD  aspirin EC 81 MG tablet Take 81 mg by mouth. 03/29/10  Yes Historical Provider, MD  Cyanocobalamin (RA VITAMIN B-12 TR) 1000 MCG TBCR Take by mouth.   Yes Historical Provider, MD    doxazosin (CARDURA) 4 MG tablet  12/07/15  Yes Historical Provider, MD  fexofenadine (ALLEGRA) 180 MG tablet Take by mouth. 07/21/13  Yes Historical Provider, MD  fluticasone (FLONASE) 50 MCG/ACT nasal spray USE ONE SPRAY IN EACH NOSTRIL EVERY DAY 10/30/15  Yes Historical Provider, MD  gabapentin (NEURONTIN) 300 MG capsule Take by mouth.   Yes Historical Provider, MD  Multiple Vitamin (MULTI-VITAMINS) TABS Take by mouth.   Yes Historical Provider, MD  rosuvastatin (CRESTOR) 5 MG tablet Take by mouth.   Yes Historical Provider, MD  spironolactone (ALDACTONE) 25 MG tablet TAKE ONE (1) TABLET BY MOUTH EVERY DAY 11/13/15  Yes Historical Provider, MD  vitamin B-12 (CYANOCOBALAMIN) 1000 MCG tablet Take by mouth.   Yes Historical Provider, MD  furosemide (LASIX) 40 MG tablet Take 1 tablet (40 mg total) by mouth daily. 12/18/15   Lucilla Lame, MD    Family History  Problem Relation Age of Onset  . Heart disease Father      Social History  Substance Use Topics  . Smoking status: Never Smoker  . Smokeless tobacco: Never Used  . Alcohol use No    Allergies as of 12/18/2015 - Review Complete 12/18/2015  Allergen Reaction Noted  . Hydrocodone Other (See Comments) 10/10/2010    Review of Systems:    All systems reviewed and negative except where noted in HPI.   Physical  Exam:  BP 131/60   Pulse (!) 54   Temp 98.7 F (37.1 C) (Oral)   Ht 5\' 6"  (1.676 m)   Wt 184 lb 3.2 oz (83.6 kg)   BMI 29.73 kg/m  No LMP for male patient. Psych:  Alert and cooperative. Normal mood and affect. General:   Alert,  Well-developed, well-nourished, pleasant and cooperative in NAD Head:  Normocephalic and atraumatic. Eyes:  Sclera clear, no icterus.   Conjunctiva pink. Ears:  Normal auditory acuity. Nose:  No deformity, discharge, or lesions. Neck:  Supple; no masses or thyromegaly. Abdomen:  Normal bowel sounds.  No bruits.  Soft, non-tender and Distended without masses.  No guarding or rebound  tenderness.  Negative Carnett sign.   Rectal:  Deferred.  Msk:  Symmetrical without gross deformities.  Good, equal movement & strength bilaterally. Pulses:  Normal pulses noted. Extremities:  No clubbing or edema.  No cyanosis. Neurologic:  Alert and oriented x3;  grossly normal neurologically. Skin:  Intact with lesions from fragile skin.  No jaundice. Lymph Nodes:  No significant cervical adenopathy. Psych:  Alert and cooperative. Normal mood and affect.  Imaging Studies: US Abdomen Complete  Result Date: 12/13/2015 CLINICAL DATA:  Abdominal distention for 1 month EXAM: ABDOMEN ULTRASOUND COMPLETE COMPARISON:  December 31, 2013 FINDINGS: Gallbladder: There is a 9 mm calculus adherent in the neck of the gallbladder. Gallbladder wall is thickened and edematous in appearance. There is pericholecystic fluid which may be associated with nearby ascites. No sonographic Murphy sign noted by sonographer. Common bile duct: Diameter: 3 mm. There is no intrahepatic, common hepatic, common bile duct dilatation demonstrated on this study Liver: There is a uniformly echogenic mass in the anterior segment of the right lobe of the liver measuring 1.4 x 1.2 x 1.5 cm, not seen on prior study. The liver is small with a nodular contour and diffuse coarsened echotexture consistent with underlying cirrhosis. IVC: No abnormality visualized in visualized portions. Portions of the inferior vena cava are obscured by gas. Pancreas: Visualized portion unremarkable. Portions of the pancreas are obscured by gas. Spleen: Spleen measures 15.8 x 13.0 x 6.7 cm with a measured splenic volume of 722 cubic cm. There is a small accessory spleen nearby. Right Kidney: Length: 9.3 cm. Echogenicity within normal limits. No hydronephrosis visualized. There is a right upper pole renal cyst measuring 3.2 x 2.3 x 2.5 cm Left Kidney: Length: 11.9 cm. Echogenicity within normal limits. No hydronephrosis visualized. There are small parapelvic cysts,  largest measuring just over 1 cm. Abdominal aorta: No aneurysm visualized. Other findings: There is moderate ascites. There is a right pleural effusion. IMPRESSION: Moderate ascites.  Right pleural effusion. 9 mm calculus adherent in the neck of the gallbladder. Gallbladder wall is thickened and appears somewhat edematous. This appearance may be seen with associated ascites. A degree of acute cholecystitis must be of concern given this appearance, however, particularly given the calculus adherent in the neck of the gallbladder. Hepatic cirrhosis. New uniformly echogenic mass seen in the right lobe of the liver measuring 1.4 x 1.5 cm. This lesion potentially may represent a hemangioma. As it is not seen previously, however, further assessment is felt to be warranted to exclude possible early hepatic cellular carcinoma. In this regard, pre and multiphasic post-contrast CT or MR of the liver advised. Portions of inferior vena cava and pancreas are obscured by gas. Visualized portions of the structures appear normal. Splenomegaly. Left kidney is larger than right kidney. This finding raises concern for  potential renal artery stenosis on the right. In this regard, question whether patient is hypertensive. These results will be called to the ordering clinician or representative by the Radiologist Assistant, and communication documented in the PACS or zVision Dashboard. Electronically Signed   By: Lowella Grip III M.D.   On: 12/13/2015 15:25   US Venous Img Upper Uni Left  Result Date: 11/28/2015 CLINICAL DATA:  Upper extremity swelling.  Left hand pain. EXAM: LEFT UPPER EXTREMITY VENOUS DOPPLER ULTRASOUND TECHNIQUE: Gray-scale sonography with graded compression, as well as color Doppler and duplex ultrasound were performed to evaluate the upper extremity deep venous system from the level of the subclavian vein and including the jugular, axillary, basilic, radial, ulnar and upper cephalic vein. Spectral Doppler  was utilized to evaluate flow at rest and with distal augmentation maneuvers. COMPARISON:  None. FINDINGS: Contralateral Subclavian Vein: Respiratory phasicity is normal and symmetric with the symptomatic side. No evidence of thrombus. Normal compressibility. Internal Jugular Vein: No evidence of thrombus. Normal compressibility, respiratory phasicity and response to augmentation. Subclavian Vein: No evidence of thrombus. Normal compressibility, respiratory phasicity and response to augmentation. Axillary Vein: No evidence of thrombus. Normal compressibility, respiratory phasicity and response to augmentation. Cephalic Vein: No evidence of thrombus. Normal compressibility, respiratory phasicity and response to augmentation. Basilic Vein: No evidence of thrombus. Normal compressibility, respiratory phasicity and response to augmentation. Brachial Veins: No evidence of thrombus. Normal compressibility, respiratory phasicity and response to augmentation. Radial Veins: No evidence of thrombus. Normal compressibility, respiratory phasicity and response to augmentation. Ulnar Veins: No evidence of thrombus. Normal compressibility, respiratory phasicity and response to augmentation. Venous Reflux:  None visualized. Other Findings: Severe thickening of the long head of the biceps tendon with a large amount of fluid in the tendon sheath. IMPRESSION: 1. No evidence of deep venous thrombosis. 2. Severe tendinosis and severe tenosynovitis of the proximal long head of the biceps tendon. Electronically Signed   By: Kathreen Devoid   On: 11/28/2015 12:29    Assessment and Plan:   Sean Scott Makael Buelna. is a 82 y.o. y/o male who comes in today with a stone found on ultrasound in the neck of the gallbladder. The patient is asymptomatic without any abdominal pain nausea vomiting fevers or chills. The patient was also found to have a new liver lesion in the right lobe. This in light of the patient having cirrhosis may represent a  hemangioma versus hepatic cell carcinoma. Due to the patient's advanced age I have discussed not pursuing any further intervention for this liver lesion and due to the stone being asymptomatic no further intervention is needed for his gallbladder. The patient's main concern is his ascites and his Lasix will be increased by 20 mg a day to 40 mg once a day. He will stay on the Aldactone 25 mg a day and will have his labs checked today and again in 1 week. If the labs show that his kidney function is stable we can go up on his diuretics. The patient has been explained the plan and agrees with it.    Lucilla Lame, MD. Marval Regal   Note: This dictation was prepared with Dragon dictation along with smaller phrase technology. Any transcriptional errors that result from this process are unintentional.

## 2015-12-18 NOTE — Patient Instructions (Addendum)
Please go to the [place] at [facility] at this time to have labs drawn. You will also have another lab next week to ensure that lasix is working ok.   Directions to Medical Mall: When leaving our office, go right. Go all of the way down to the very end of the hallway. You will have a purple wall in front of you. You will now have a tunnel to the hospital on your left hand side. Go through this tunnel and the elevators will be on your left. Go down to the 1st floor and take a slight left. The very first desk on the right hand side is the registration desk.  Your Lasix has been called into your [facility]. Please start this immediately and stop taking your 20mg  Lasix tablets.

## 2015-12-25 ENCOUNTER — Ambulatory Visit
Admission: RE | Admit: 2015-12-25 | Discharge: 2015-12-25 | Disposition: A | Discharge status: Home / Self Care | Payer: Medicare Other | Source: Ambulatory Visit | Attending: Internal Medicine | Admitting: Internal Medicine

## 2015-12-25 ENCOUNTER — Other Ambulatory Visit
Admission: RE | Admit: 2015-12-25 | Discharge: 2015-12-25 | Disposition: A | Discharge status: Home / Self Care | Payer: Medicare Other | Source: Ambulatory Visit | Attending: Gastroenterology | Admitting: Gastroenterology

## 2015-12-25 DIAGNOSIS — K7460 Unspecified cirrhosis of liver: Secondary | ICD-10-CM | POA: Insufficient documentation

## 2015-12-25 DIAGNOSIS — I509 Heart failure, unspecified: Secondary | ICD-10-CM | POA: Diagnosis not present

## 2015-12-25 DIAGNOSIS — R0602 Shortness of breath: Secondary | ICD-10-CM | POA: Diagnosis present

## 2015-12-25 DIAGNOSIS — Z951 Presence of aortocoronary bypass graft: Secondary | ICD-10-CM | POA: Diagnosis not present

## 2015-12-25 DIAGNOSIS — I4891 Unspecified atrial fibrillation: Secondary | ICD-10-CM | POA: Diagnosis not present

## 2015-12-25 LAB — COMPREHENSIVE METABOLIC PANEL
ALBUMIN: 3 g/dL — AB (ref 3.5–5.0)
ALT: 40 U/L (ref 17–63)
AST: 73 U/L — AB (ref 15–41)
Alkaline Phosphatase: 411 U/L — ABNORMAL HIGH (ref 38–126)
Anion gap: 7 (ref 5–15)
BILIRUBIN TOTAL: 1.4 mg/dL — AB (ref 0.3–1.2)
BUN: 19 mg/dL (ref 6–20)
CHLORIDE: 100 mmol/L — AB (ref 101–111)
CO2: 24 mmol/L (ref 22–32)
CREATININE: 0.77 mg/dL (ref 0.61–1.24)
Calcium: 8.6 mg/dL — ABNORMAL LOW (ref 8.9–10.3)
GFR calc Af Amer: >60 mL/min (ref >60)
GLUCOSE: 118 mg/dL — AB (ref 65–99)
POTASSIUM: 4 mmol/L (ref 3.5–5.1)
Sodium: 131 mmol/L — ABNORMAL LOW (ref 135–145)
Total Protein: 6.6 g/dL (ref 6.5–8.1)

## 2015-12-25 NOTE — Progress Notes (Signed)
*  PRELIMINARY RESULTS* Echocardiogram 2D Echocardiogram has been performed.  Sean Jensen 12/25/2015, 11:27 AM

## 2015-12-27 ENCOUNTER — Ambulatory Visit: Payer: Self-pay | Admitting: Surgery

## 2015-12-28 ENCOUNTER — Telehealth: Payer: Self-pay

## 2015-12-28 NOTE — Telephone Encounter (Signed)
Tried contacting pt but vm box had not been set up yet.

## 2015-12-28 NOTE — Telephone Encounter (Signed)
-----   Message from Lucilla Lame, MD sent at 12/25/2015  1:09 PM EST ----- The patient know that the kidney function is still good. If the patient's ascites has not gone down we can increase his diuretics.

## 2015-12-31 NOTE — Telephone Encounter (Signed)
Spoke with pt and advised of lab results. Pt stated the fluid isn't getting worse but not getting better. He isn't uncomfortable but feels he needs to increase medication to reduce the fluid. Pt currently taking lasix 40mg  daily and spironolactone 25mg . Please advise what to increase.

## 2016-01-02 NOTE — Telephone Encounter (Signed)
That the patient know that his kidney function is stable but slightly elevated and stable. His wife also stated that he was having side effects when he was on the higher dose. Therefore since he is not uncomfortable I would keep him at the present dose.

## 2016-01-02 NOTE — Telephone Encounter (Signed)
Pt notified per Dr. Allen Norris, since his vitals are currently stable. He prefers to not increase his diuretics. Advised pt to have pharmacy send refill request when he has a weeks worth of medication.

## 2016-01-15 ENCOUNTER — Telehealth: Payer: Self-pay

## 2016-01-15 NOTE — Telephone Encounter (Signed)
Pt has 2 days of his lasix 40mg  left. You did not want to go up on the medication because his kidney function was normal but on the higher end of normal. He says he isn't getting worse but still has fluid on his belly. He is also taking Spironolactone 25mg . Please advise if you want him to stay on the same dose of lasix and ok to refill.

## 2016-01-17 ENCOUNTER — Other Ambulatory Visit: Payer: Self-pay

## 2016-01-17 MED ORDER — FUROSEMIDE 40 MG PO TABS: 40.0000 mg | ORAL_TABLET | Freq: Every day | ORAL | 5 refills | Status: DC

## 2016-01-17 NOTE — Telephone Encounter (Signed)
We can not go up on his medication. We can set him up for a paracentesis.

## 2016-01-17 NOTE — Telephone Encounter (Signed)
Contacted Sean Jensen and informed him Dr. Allen Norris did not want to go up on his lasix. Refill sent to his pharmacy. Sean Jensen did not think it was bad enough for a paracentesis.

## 2016-01-30 ENCOUNTER — Telehealth: Payer: Self-pay | Admitting: Gastroenterology

## 2016-01-30 NOTE — Telephone Encounter (Signed)
Feet are swelling and getting dark. He is taking fluid pills and legs and feet are getting tight. He can walk on them.

## 2016-02-04 NOTE — Telephone Encounter (Signed)
The patient can increase his Lasix by 20 mg since his last labs showed his kidney function to be good. He should have his labs checked again in a week after changing the medication.

## 2016-02-05 ENCOUNTER — Other Ambulatory Visit: Payer: Self-pay

## 2016-02-05 DIAGNOSIS — K7460 Unspecified cirrhosis of liver: Secondary | ICD-10-CM

## 2016-02-05 DIAGNOSIS — R188 Other ascites: Principal | ICD-10-CM

## 2016-02-05 NOTE — Telephone Encounter (Signed)
Pt's son notified of lasix increase to 60mg  total daily and repeat labs next week. Pt was with son during conversation. Order for BMP was placed.

## 2016-02-14 ENCOUNTER — Other Ambulatory Visit
Admission: RE | Admit: 2016-02-14 | Discharge: 2016-02-14 | Disposition: A | Discharge status: Home / Self Care | Payer: Medicare Other | Source: Ambulatory Visit | Attending: Gastroenterology | Admitting: Gastroenterology

## 2016-02-14 DIAGNOSIS — R188 Other ascites: Secondary | ICD-10-CM | POA: Insufficient documentation

## 2016-02-14 DIAGNOSIS — K7460 Unspecified cirrhosis of liver: Secondary | ICD-10-CM

## 2016-02-14 LAB — BASIC METABOLIC PANEL
ANION GAP: 8 (ref 5–15)
BUN: 21 mg/dL — ABNORMAL HIGH (ref 6–20)
CALCIUM: 8.6 mg/dL — AB (ref 8.9–10.3)
CHLORIDE: 96 mmol/L — AB (ref 101–111)
CO2: 24 mmol/L (ref 22–32)
CREATININE: 0.82 mg/dL (ref 0.61–1.24)
GFR calc Af Amer: >60 mL/min (ref >60)
GFR calc non Af Amer: >60 mL/min (ref >60)
GLUCOSE: 131 mg/dL — AB (ref 65–99)
Potassium: 4.3 mmol/L (ref 3.5–5.1)
Sodium: 128 mmol/L — ABNORMAL LOW (ref 135–145)

## 2016-02-18 ENCOUNTER — Telehealth: Payer: Self-pay

## 2016-02-18 NOTE — Telephone Encounter (Signed)
Pt notified of results. Pt stated about 30 mins after he gets up in the mornings his feet start to swell again and he cannot tell a change in his abdomen with fluid. No pain. He says he is keeping his feet elevated. Increase medication or have pt follow up?

## 2016-02-18 NOTE — Telephone Encounter (Signed)
Patient can go up 25 mg on his Aldactone.

## 2016-02-18 NOTE — Telephone Encounter (Signed)
-----   Message from Lucilla Lame, MD sent at 02/17/2016  5:47 PM EST ----- That the patient knows that his kidney function is stable on His diuretic medication

## 2016-02-19 ENCOUNTER — Other Ambulatory Visit: Payer: Self-pay

## 2016-02-19 DIAGNOSIS — R188 Other ascites: Principal | ICD-10-CM

## 2016-02-19 DIAGNOSIS — K7460 Unspecified cirrhosis of liver: Secondary | ICD-10-CM

## 2016-02-19 MED ORDER — SPIRONOLACTONE 50 MG PO TABS: 50.0000 mg | ORAL_TABLET | Freq: Every day | ORAL | 5 refills | Status: DC

## 2016-02-19 NOTE — Telephone Encounter (Signed)
Pt notified he can increase his Spironolactone to 50mg  daily. New rx sent to pharmacy. Pt also advised we need to repeat his labs in 1 week to check his kidneys due to medication increase.

## 2016-02-20 ENCOUNTER — Other Ambulatory Visit: Payer: Self-pay

## 2016-02-20 MED ORDER — FUROSEMIDE 40 MG PO TABS: ORAL_TABLET | ORAL | 5 refills | Status: DC

## 2016-02-27 ENCOUNTER — Other Ambulatory Visit
Admission: RE | Admit: 2016-02-27 | Discharge: 2016-02-27 | Disposition: A | Discharge status: Home / Self Care | Payer: Medicare Other | Source: Ambulatory Visit | Attending: Gastroenterology | Admitting: Gastroenterology

## 2016-02-27 DIAGNOSIS — R188 Other ascites: Secondary | ICD-10-CM

## 2016-02-27 DIAGNOSIS — K7460 Unspecified cirrhosis of liver: Secondary | ICD-10-CM | POA: Diagnosis present

## 2016-02-27 LAB — BASIC METABOLIC PANEL
Anion gap: 10 (ref 5–15)
BUN: 21 mg/dL — AB (ref 6–20)
CALCIUM: 8.6 mg/dL — AB (ref 8.9–10.3)
CO2: 25 mmol/L (ref 22–32)
CREATININE: 0.91 mg/dL (ref 0.61–1.24)
Chloride: 96 mmol/L — ABNORMAL LOW (ref 101–111)
GFR calc Af Amer: >60 mL/min (ref >60)
GLUCOSE: 102 mg/dL — AB (ref 65–99)
Potassium: 4 mmol/L (ref 3.5–5.1)
Sodium: 131 mmol/L — ABNORMAL LOW (ref 135–145)

## 2016-03-05 ENCOUNTER — Telehealth: Payer: Self-pay

## 2016-03-05 NOTE — Telephone Encounter (Signed)
-----   Message from Lucilla Lame, MD sent at 02/29/2016 10:48 AM EST ----- Let the patient know that his labs are stable.

## 2016-03-05 NOTE — Telephone Encounter (Signed)
Pt's son notified labs are stable.

## 2016-03-11 ENCOUNTER — Encounter: Payer: Self-pay | Admitting: Gastroenterology

## 2016-03-11 ENCOUNTER — Other Ambulatory Visit: Payer: Self-pay

## 2016-03-11 ENCOUNTER — Ambulatory Visit (INDEPENDENT_AMBULATORY_CARE_PROVIDER_SITE_OTHER): Payer: Medicare Other | Admitting: Gastroenterology

## 2016-03-11 VITALS — Temp 97.4°F | Ht 66.0 in

## 2016-03-11 DIAGNOSIS — R188 Other ascites: Secondary | ICD-10-CM | POA: Diagnosis not present

## 2016-03-11 NOTE — Progress Notes (Signed)
Primary Care Physician: Maryland Pink, MD  Primary Gastroenterologist:  Dr. Lucilla Lame  Chief Complaint  Patient presents with  . Ascites    HPI: Sean P Zahran Burford. is a 83 y.o. male here For follow-up of his ascites. The patient states that his ascites has not gotten any bigger but continues to be bothersome. The patient has slowly increased on his diuretics with his kidney function remaining stable. He also reports that he has lower extremity edema. The patient is presently on Lasix 60 mg every morning with Aldactone 50 mg every morning.  Current Outpatient Prescriptions  Medication Sig Dispense Refill  . acetaminophen (TYLENOL) 650 MG CR tablet Take by mouth.    . Ascorbic Acid (VITAMIN C) 1000 MG tablet Take by mouth.    Marland Kitchen aspirin EC 81 MG tablet Take 81 mg by mouth.    . Cyanocobalamin (RA VITAMIN B-12 TR) 1000 MCG TBCR Take by mouth.    . doxazosin (CARDURA) 4 MG tablet     . fexofenadine (ALLEGRA) 180 MG tablet Take by mouth.    . fluticasone (FLONASE) 50 MCG/ACT nasal spray USE ONE SPRAY IN EACH NOSTRIL EVERY DAY    . furosemide (LASIX) 40 MG tablet Take 3 tablets daily (60mg  total) 90 tablet 5  . gabapentin (NEURONTIN) 300 MG capsule Take by mouth.    . Multiple Vitamin (MULTI-VITAMINS) TABS Take by mouth.    . rosuvastatin (CRESTOR) 5 MG tablet Take by mouth.    . spironolactone (ALDACTONE) 50 MG tablet Take 1 tablet (50 mg total) by mouth daily. 30 tablet 5  . vitamin B-12 (CYANOCOBALAMIN) 1000 MCG tablet Take by mouth.     No current facility-administered medications for this visit.     Allergies as of 03/11/2016 - Review Complete 12/18/2015  Allergen Reaction Noted  . Hydrocodone Other (See Comments) 10/10/2010    ROS:  General: Negative for anorexia, weight loss, fever, chills, fatigue, weakness. ENT: Negative for hoarseness, difficulty swallowing , nasal congestion. CV: Negative for chest pain, angina, palpitations, dyspnea on exertion, peripheral edema.    Respiratory: Negative for dyspnea at rest, dyspnea on exertion, cough, sputum, wheezing.  GI: See history of present illness. GU:  Negative for dysuria, hematuria, urinary incontinence, urinary frequency, nocturnal urination.  Endo: Negative for unusual weight change.    Physical Examination:   Temp 97.4 F (36.3 C) (Oral)   Ht 5\' 6"  (1.676 m)   General: Well-nourished, well-developed in no acute distress.  Abdomen: Bowel sounds are normal, nontender, positive distention with ascites, no hepatosplenomegaly or masses, no abdominal bruits or hernia , no rebound or guarding.   Extremities: Positive lower extremity edema more on the right than the left. No clubbing or deformities. Neuro: Alert and oriented x 3.  Grossly intact. Skin: Warm and dry, no jaundice.   Psych: Alert and cooperative, normal mood and affect.  Labs:    Imaging Studies: No results found.  Assessment and Plan:   Sean P Kaiyden Aftab. is a 83 y.o. y/o male who comes in today with continued ascites. The patient is concerned that his fluid has not completely, off despite his medication and being on a low-sodium diet. The patient has been told to continue his diet and to add Lasix 40 mg in the afternoon in addition to the Lasix and Aldactone he is taking in the morning. The patient will have labs sent off in one week's time to check his kidney function and his medication will be adjusted at that  time. The patient has been given the option of having a paracentesis with the risks that that involves and has decided to continue treating his ascites medically.    Lucilla Lame, MD. Marval Regal   Note: This dictation was prepared with Dragon dictation along with smaller phrase technology. Any transcriptional errors that result from this process are unintentional.

## 2016-03-11 NOTE — Patient Instructions (Signed)
You are to start taking your Lasix 60mg  and Aldactone 50mg  every morning and then Lasix 40mg  in the afternoon at 4:00pm

## 2016-03-24 ENCOUNTER — Other Ambulatory Visit: Payer: Self-pay | Admitting: Gastroenterology

## 2016-03-26 ENCOUNTER — Other Ambulatory Visit
Admission: RE | Admit: 2016-03-26 | Discharge: 2016-03-26 | Disposition: A | Discharge status: Home / Self Care | Payer: Medicare Other | Source: Ambulatory Visit | Attending: Gastroenterology | Admitting: Gastroenterology

## 2016-03-26 ENCOUNTER — Other Ambulatory Visit: Payer: Self-pay

## 2016-03-26 DIAGNOSIS — K7460 Unspecified cirrhosis of liver: Secondary | ICD-10-CM | POA: Diagnosis present

## 2016-03-26 DIAGNOSIS — R188 Other ascites: Principal | ICD-10-CM

## 2016-03-26 LAB — BASIC METABOLIC PANEL
ANION GAP: 7 (ref 5–15)
BUN: 30 mg/dL — ABNORMAL HIGH (ref 6–20)
CO2: 27 mmol/L (ref 22–32)
CREATININE: 0.99 mg/dL (ref 0.61–1.24)
Calcium: 8.5 mg/dL — ABNORMAL LOW (ref 8.9–10.3)
Chloride: 97 mmol/L — ABNORMAL LOW (ref 101–111)
GFR calc Af Amer: >60 mL/min (ref >60)
GFR calc non Af Amer: >60 mL/min (ref >60)
GLUCOSE: 109 mg/dL — AB (ref 65–99)
Potassium: 4.2 mmol/L (ref 3.5–5.1)
Sodium: 131 mmol/L — ABNORMAL LOW (ref 135–145)

## 2016-03-28 ENCOUNTER — Telehealth: Payer: Self-pay

## 2016-03-28 NOTE — Telephone Encounter (Signed)
Pt notified of lab results

## 2016-03-28 NOTE — Telephone Encounter (Signed)
-----   Message from Lucilla Lame, MD sent at 03/27/2016 12:07 PM EST ----- Let the patient know that his labs are stable.

## 2016-04-09 ENCOUNTER — Other Ambulatory Visit: Payer: Self-pay

## 2016-04-09 ENCOUNTER — Other Ambulatory Visit
Admission: RE | Admit: 2016-04-09 | Discharge: 2016-04-09 | Disposition: A | Discharge status: Home / Self Care | Payer: Medicare Other | Source: Ambulatory Visit | Attending: Gastroenterology | Admitting: Gastroenterology

## 2016-04-09 DIAGNOSIS — R188 Other ascites: Principal | ICD-10-CM

## 2016-04-09 DIAGNOSIS — K7460 Unspecified cirrhosis of liver: Secondary | ICD-10-CM | POA: Diagnosis present

## 2016-04-09 LAB — BASIC METABOLIC PANEL
ANION GAP: 8 (ref 5–15)
BUN: 26 mg/dL — AB (ref 6–20)
CO2: 25 mmol/L (ref 22–32)
Calcium: 8.6 mg/dL — ABNORMAL LOW (ref 8.9–10.3)
Chloride: 96 mmol/L — ABNORMAL LOW (ref 101–111)
Creatinine, Ser: 0.98 mg/dL (ref 0.61–1.24)
GFR calc Af Amer: >60 mL/min (ref >60)
GLUCOSE: 114 mg/dL — AB (ref 65–99)
POTASSIUM: 4.1 mmol/L (ref 3.5–5.1)
Sodium: 129 mmol/L — ABNORMAL LOW (ref 135–145)

## 2016-04-10 ENCOUNTER — Other Ambulatory Visit: Payer: Self-pay

## 2016-04-10 DIAGNOSIS — K7460 Unspecified cirrhosis of liver: Secondary | ICD-10-CM

## 2016-04-10 DIAGNOSIS — R188 Other ascites: Principal | ICD-10-CM

## 2016-04-10 MED ORDER — FUROSEMIDE 20 MG PO TABS: ORAL_TABLET | ORAL | 6 refills | Status: DC

## 2016-04-10 NOTE — Telephone Encounter (Signed)
-----   Message from Lucilla Lame, MD sent at 04/10/2016  6:39 AM EDT ----- Let the patient know his kidney function is stable

## 2016-04-10 NOTE — Telephone Encounter (Signed)
Pt's son, Sherren Mocha notified of results. He was also advised I have sent rx refill to pharmacy.

## 2016-04-30 ENCOUNTER — Other Ambulatory Visit
Admission: RE | Admit: 2016-04-30 | Discharge: 2016-04-30 | Disposition: A | Discharge status: Home / Self Care | Payer: Medicare Other | Source: Ambulatory Visit | Attending: Gastroenterology | Admitting: Gastroenterology

## 2016-04-30 DIAGNOSIS — K7460 Unspecified cirrhosis of liver: Secondary | ICD-10-CM | POA: Diagnosis present

## 2016-04-30 DIAGNOSIS — R188 Other ascites: Secondary | ICD-10-CM

## 2016-04-30 LAB — BASIC METABOLIC PANEL
ANION GAP: 9 (ref 5–15)
BUN: 28 mg/dL — ABNORMAL HIGH (ref 6–20)
CHLORIDE: 93 mmol/L — AB (ref 101–111)
CO2: 25 mmol/L (ref 22–32)
Calcium: 8.2 mg/dL — ABNORMAL LOW (ref 8.9–10.3)
Creatinine, Ser: 0.95 mg/dL (ref 0.61–1.24)
Glucose, Bld: 125 mg/dL — ABNORMAL HIGH (ref 65–99)
POTASSIUM: 4.1 mmol/L (ref 3.5–5.1)
SODIUM: 127 mmol/L — AB (ref 135–145)

## 2016-05-02 ENCOUNTER — Telehealth: Payer: Self-pay | Admitting: Gastroenterology

## 2016-05-02 ENCOUNTER — Telehealth: Payer: Self-pay

## 2016-05-02 NOTE — Telephone Encounter (Signed)
Pt notified of lab results. Pt feels like he still has a lot of fluid and is requesting to have a paracentesis. Do you want to set him up?

## 2016-05-02 NOTE — Telephone Encounter (Signed)
Spoke with pt's sister, [name] regarding the fluid building up in pt's legs. She stated he stomach is about the same size but building around his legs and feet. She stated she is not giving him his afternoon diuretic because she feels its pulling too much fluid. I advised her that is why you wanted him to take the afternoon dose. She wanted to know if we can order a scan to see how much fluid is there. She stated he is coughing but not having problems breathing.

## 2016-05-02 NOTE — Telephone Encounter (Signed)
Please call Melburn Hake (Eyad's sister) (She LVM) [phone] or [phone]. Sherrod is having fluid build up again and is coughing.

## 2016-05-02 NOTE — Telephone Encounter (Signed)
-----   Message from Lucilla Lame, MD sent at 05/01/2016  1:47 PM EDT ----- Let the patient know that his labs are stable.

## 2016-05-05 ENCOUNTER — Other Ambulatory Visit: Payer: Self-pay

## 2016-05-05 DIAGNOSIS — R188 Other ascites: Principal | ICD-10-CM

## 2016-05-05 DIAGNOSIS — K7460 Unspecified cirrhosis of liver: Secondary | ICD-10-CM

## 2016-05-05 NOTE — Telephone Encounter (Signed)
Orders have been sent to central scheduling, Special scheduling to set up. Waiting for return call with appt.

## 2016-05-05 NOTE — Telephone Encounter (Signed)
Yes. You can set him up for a paracentesis.

## 2016-05-07 ENCOUNTER — Other Ambulatory Visit: Payer: Self-pay

## 2016-05-07 ENCOUNTER — Telehealth: Payer: Self-pay

## 2016-05-07 DIAGNOSIS — R188 Other ascites: Secondary | ICD-10-CM

## 2016-05-07 MED ORDER — ALBUMIN HUMAN 25 % IV SOLN: 25.0000 g | Freq: Once | INTRAVENOUS | Status: DC

## 2016-05-07 NOTE — Telephone Encounter (Signed)
Pt scheduled for US paracentesis at [facility] on Thursday, April 12th at 1:30pm. Pt is to arrive at 12:30pm to have labs done. Pt's sister, Melburn Hake has been notified of this appt.

## 2016-05-08 ENCOUNTER — Other Ambulatory Visit
Admission: RE | Admit: 2016-05-08 | Discharge: 2016-05-08 | Disposition: A | Discharge status: Home / Self Care | Payer: Medicare Other | Source: Ambulatory Visit | Attending: Gastroenterology | Admitting: Gastroenterology

## 2016-05-08 ENCOUNTER — Ambulatory Visit
Admission: RE | Admit: 2016-05-08 | Discharge: 2016-05-08 | Disposition: A | Discharge status: Home / Self Care | Payer: Medicare Other | Source: Ambulatory Visit | Attending: Gastroenterology | Admitting: Gastroenterology

## 2016-05-08 DIAGNOSIS — R188 Other ascites: Principal | ICD-10-CM

## 2016-05-08 DIAGNOSIS — K7460 Unspecified cirrhosis of liver: Secondary | ICD-10-CM

## 2016-05-08 LAB — CBC WITH DIFFERENTIAL/PLATELET
Basophils Absolute: 0 10*3/uL (ref 0–0.1)
Basophils Relative: 1 %
EOS PCT: 1 %
Eosinophils Absolute: 0 10*3/uL (ref 0–0.7)
HCT: 34.1 % — ABNORMAL LOW (ref 40.0–52.0)
Hemoglobin: 11.4 g/dL — ABNORMAL LOW (ref 13.0–18.0)
LYMPHS ABS: 0.3 10*3/uL — AB (ref 1.0–3.6)
LYMPHS PCT: 8 %
MCH: 30.7 pg (ref 26.0–34.0)
MCHC: 33.4 g/dL (ref 32.0–36.0)
MCV: 91.8 fL (ref 80.0–100.0)
MONO ABS: 0.6 10*3/uL (ref 0.2–1.0)
Monocytes Relative: 15 %
Neutro Abs: 3 10*3/uL (ref 1.4–6.5)
Neutrophils Relative %: 75 %
PLATELETS: 121 10*3/uL — AB (ref 150–440)
RBC: 3.71 MIL/uL — AB (ref 4.40–5.90)
RDW: 15.7 % — AB (ref 11.5–14.5)
WBC: 4 10*3/uL (ref 3.8–10.6)

## 2016-05-08 LAB — PROTIME-INR
INR: 1.34
PROTHROMBIN TIME: 16.7 s — AB (ref 11.4–15.2)

## 2016-05-08 MED ORDER — ALBUMIN HUMAN 25 % IV SOLN
25.0000 g | Freq: Once | INTRAVENOUS | Status: DC
  Filled 2016-05-08: qty 100

## 2016-05-08 MED ORDER — ALBUMIN HUMAN 25 % IV SOLN
25.0000 g | Freq: Once | INTRAVENOUS | Status: AC
  Administered 2016-05-08: 25 g via INTRAVENOUS
  Filled 2016-05-08: qty 100

## 2016-05-08 NOTE — Procedures (Signed)
Paracentesis. Amount pending EBL 0 Comp 0 

## 2016-05-08 NOTE — Sedation Documentation (Signed)
Dr Allen Norris office notified that lab were drawn and are completed in [facility]

## 2016-07-02 ENCOUNTER — Other Ambulatory Visit: Payer: Self-pay | Admitting: Gastroenterology

## 2016-07-02 ENCOUNTER — Other Ambulatory Visit: Payer: Self-pay

## 2016-07-02 DIAGNOSIS — R188 Other ascites: Secondary | ICD-10-CM

## 2016-07-02 MED ORDER — ALBUMIN HUMAN 25 % IV SOLN: 25.0000 g | Freq: Once | INTRAVENOUS | Status: DC

## 2016-07-04 ENCOUNTER — Ambulatory Visit
Admission: RE | Admit: 2016-07-04 | Discharge: 2016-07-04 | Disposition: A | Discharge status: Home / Self Care | Payer: Medicare Other | Source: Ambulatory Visit | Attending: Gastroenterology | Admitting: Gastroenterology

## 2016-07-04 DIAGNOSIS — R188 Other ascites: Secondary | ICD-10-CM

## 2016-07-04 DIAGNOSIS — K7031 Alcoholic cirrhosis of liver with ascites: Secondary | ICD-10-CM | POA: Insufficient documentation

## 2016-07-04 MED ORDER — ALBUMIN HUMAN 25 % IV SOLN
25.0000 g | Freq: Once | INTRAVENOUS | Status: DC
  Filled 2016-07-04: qty 100

## 2016-07-04 NOTE — Procedures (Signed)
PROCEDURE SUMMARY:  Successful US guided paracentesis from right lateral abdomen.  Yielded 2.8 liters of clear yellow fluid.  No immediate complications.  Pt tolerated well.   Patient fell about 2 days ago and injured his right arm. His son asked if we would change the bandage.  Patient had dry Xeroform gauze over the right arm wound. It was moistened with normal saline flush and upon removal of the gauze, there was copious bleeding from a very large raw surface which measured about 8 inches in length and 3 inches in width.  Pressure was held to control the bleeding.  A Vaseline gauze was placed on the upper and lower arm wounds, dry gauze over that, then wrapped with coban (not too tight).  I instructed patient and his son Sherren Mocha to change the dressing again tonight and apply a copious amount of Vaseline to the wound, cover it with Telfa non-stick, and wrap it again.  I instructed them to make sure the wound stays moist for better healing and to avoid more bleeding.  I educated them both on what happens if the wound gets to dry, that upon removal of the bandage, it would certainly bleed again and they would be starting all over.  I educated them on keeping a Vaseline dressing on it until it is completely healed. They should AVOID Neosporin as this can prolong wound healing if used too long.  They both expressed understanding. I also recommended if they have any issues to follow up with a wound care professional. There is a wound center in [place] and in [place].  Freman Lapage S Jarrah Babich PA-C 07/04/2016 2:15 PM

## 2016-08-21 ENCOUNTER — Encounter: Payer: Self-pay | Admitting: Emergency Medicine

## 2016-08-21 ENCOUNTER — Inpatient Hospital Stay
Admission: EM | Admit: 2016-08-21 | Discharge: 2016-08-25 | DRG: 603 | Disposition: A | Discharge status: Skilled Nursing Facility | Payer: Medicare Other | Attending: Internal Medicine | Admitting: Internal Medicine

## 2016-08-21 ENCOUNTER — Emergency Department: Payer: Medicare Other

## 2016-08-21 DIAGNOSIS — L0390 Cellulitis, unspecified: Secondary | ICD-10-CM | POA: Diagnosis present

## 2016-08-21 DIAGNOSIS — D696 Thrombocytopenia, unspecified: Secondary | ICD-10-CM | POA: Diagnosis present

## 2016-08-21 DIAGNOSIS — I2510 Atherosclerotic heart disease of native coronary artery without angina pectoris: Secondary | ICD-10-CM | POA: Diagnosis present

## 2016-08-21 DIAGNOSIS — Z96651 Presence of right artificial knee joint: Secondary | ICD-10-CM | POA: Diagnosis present

## 2016-08-21 DIAGNOSIS — Z79899 Other long term (current) drug therapy: Secondary | ICD-10-CM

## 2016-08-21 DIAGNOSIS — Z85828 Personal history of other malignant neoplasm of skin: Secondary | ICD-10-CM

## 2016-08-21 DIAGNOSIS — J90 Pleural effusion, not elsewhere classified: Secondary | ICD-10-CM

## 2016-08-21 DIAGNOSIS — Z951 Presence of aortocoronary bypass graft: Secondary | ICD-10-CM | POA: Diagnosis not present

## 2016-08-21 DIAGNOSIS — K7460 Unspecified cirrhosis of liver: Secondary | ICD-10-CM | POA: Diagnosis present

## 2016-08-21 DIAGNOSIS — Z7951 Long term (current) use of inhaled steroids: Secondary | ICD-10-CM

## 2016-08-21 DIAGNOSIS — L03115 Cellulitis of right lower limb: Secondary | ICD-10-CM | POA: Diagnosis present

## 2016-08-21 DIAGNOSIS — Z885 Allergy status to narcotic agent status: Secondary | ICD-10-CM | POA: Diagnosis not present

## 2016-08-21 DIAGNOSIS — R0600 Dyspnea, unspecified: Secondary | ICD-10-CM

## 2016-08-21 DIAGNOSIS — I509 Heart failure, unspecified: Secondary | ICD-10-CM

## 2016-08-21 DIAGNOSIS — I5032 Chronic diastolic (congestive) heart failure: Secondary | ICD-10-CM | POA: Diagnosis present

## 2016-08-21 DIAGNOSIS — Z7982 Long term (current) use of aspirin: Secondary | ICD-10-CM | POA: Diagnosis not present

## 2016-08-21 DIAGNOSIS — R188 Other ascites: Secondary | ICD-10-CM | POA: Diagnosis present

## 2016-08-21 LAB — CBC WITH DIFFERENTIAL/PLATELET
BASOS ABS: 0 10*3/uL (ref 0–0.1)
Basophils Relative: 0 %
EOS ABS: 0 10*3/uL (ref 0–0.7)
EOS PCT: 0 %
HCT: 34.1 % — ABNORMAL LOW (ref 40.0–52.0)
Hemoglobin: 11.8 g/dL — ABNORMAL LOW (ref 13.0–18.0)
Lymphocytes Relative: 4 %
Lymphs Abs: 0.4 10*3/uL — ABNORMAL LOW (ref 1.0–3.6)
MCH: 32.4 pg (ref 26.0–34.0)
MCHC: 34.6 g/dL (ref 32.0–36.0)
MCV: 93.8 fL (ref 80.0–100.0)
Monocytes Absolute: 1 10*3/uL (ref 0.2–1.0)
Monocytes Relative: 11 %
Neutro Abs: 8.5 10*3/uL — ABNORMAL HIGH (ref 1.4–6.5)
Neutrophils Relative %: 85 %
PLATELETS: 68 10*3/uL — AB (ref 150–440)
RBC: 3.63 MIL/uL — AB (ref 4.40–5.90)
RDW: 15.1 % — ABNORMAL HIGH (ref 11.5–14.5)
WBC: 9.9 10*3/uL (ref 3.8–10.6)

## 2016-08-21 LAB — COMPREHENSIVE METABOLIC PANEL
ALT: 27 U/L (ref 17–63)
AST: 46 U/L — AB (ref 15–41)
Albumin: 3 g/dL — ABNORMAL LOW (ref 3.5–5.0)
Alkaline Phosphatase: 267 U/L — ABNORMAL HIGH (ref 38–126)
Anion gap: 10 (ref 5–15)
BILIRUBIN TOTAL: 2.8 mg/dL — AB (ref 0.3–1.2)
BUN: 34 mg/dL — AB (ref 6–20)
CO2: 24 mmol/L (ref 22–32)
CREATININE: 1.1 mg/dL (ref 0.61–1.24)
Calcium: 8.6 mg/dL — ABNORMAL LOW (ref 8.9–10.3)
Chloride: 95 mmol/L — ABNORMAL LOW (ref 101–111)
GFR calc Af Amer: >60 mL/min (ref >60)
GFR, EST NON AFRICAN AMERICAN: 56 mL/min — AB (ref >60)
Glucose, Bld: 113 mg/dL — ABNORMAL HIGH (ref 65–99)
POTASSIUM: 4.1 mmol/L (ref 3.5–5.1)
Sodium: 129 mmol/L — ABNORMAL LOW (ref 135–145)
TOTAL PROTEIN: 6.7 g/dL (ref 6.5–8.1)

## 2016-08-21 MED ORDER — SODIUM CHLORIDE 0.9 % IV SOLN: 250.0000 mL | INTRAVENOUS | Status: DC | PRN

## 2016-08-21 MED ORDER — SODIUM CHLORIDE 0.9 % IV SOLN: 1.5000 g | Freq: Four times a day (QID) | INTRAVENOUS | Status: DC

## 2016-08-21 MED ORDER — AMPICILLIN-SULBACTAM SODIUM 3 (2-1) G IJ SOLR
3.0000 g | Freq: Once | INTRAMUSCULAR | Status: AC
  Administered 2016-08-21: 3 g via INTRAVENOUS
  Filled 2016-08-21: qty 3

## 2016-08-21 MED ORDER — ASPIRIN EC 81 MG PO TBEC
81.0000 mg | DELAYED_RELEASE_TABLET | Freq: Every day | ORAL | Status: DC
  Administered 2016-08-22 – 2016-08-23 (×2): 81 mg via ORAL
  Filled 2016-08-21 (×2): qty 1

## 2016-08-21 MED ORDER — VITAMIN C 500 MG PO TABS
1000.0000 mg | ORAL_TABLET | Freq: Every day | ORAL | Status: DC
  Administered 2016-08-22 – 2016-08-25 (×4): 1000 mg via ORAL
  Filled 2016-08-21 (×4): qty 2

## 2016-08-21 MED ORDER — SODIUM CHLORIDE 0.9 % IV SOLN
3.0000 g | Freq: Four times a day (QID) | INTRAVENOUS | Status: DC
  Administered 2016-08-22 – 2016-08-24 (×9): 3 g via INTRAVENOUS
  Filled 2016-08-21 (×13): qty 3

## 2016-08-21 MED ORDER — VITAMIN B-12 1000 MCG PO TABS
1000.0000 ug | ORAL_TABLET | Freq: Every day | ORAL | Status: DC
  Administered 2016-08-22 – 2016-08-25 (×4): 1000 ug via ORAL
  Filled 2016-08-21 (×3): qty 1

## 2016-08-21 MED ORDER — LORATADINE 10 MG PO TABS
10.0000 mg | ORAL_TABLET | Freq: Every day | ORAL | Status: DC
Start: 2016-08-22 — End: 2016-08-25
  Administered 2016-08-22 – 2016-08-25 (×4): 10 mg via ORAL
  Filled 2016-08-21 (×4): qty 1

## 2016-08-21 MED ORDER — DOXAZOSIN MESYLATE 4 MG PO TABS
4.0000 mg | ORAL_TABLET | Freq: Every day | ORAL | Status: DC
  Administered 2016-08-22 – 2016-08-24 (×3): 4 mg via ORAL
  Filled 2016-08-21 (×4): qty 1

## 2016-08-21 MED ORDER — ACETAMINOPHEN 325 MG PO TABS
650.0000 mg | ORAL_TABLET | Freq: Four times a day (QID) | ORAL | Status: DC | PRN
Start: 2016-08-21 — End: 2016-08-25
  Administered 2016-08-23 – 2016-08-24 (×3): 650 mg via ORAL
  Administered 2016-08-24: 325 mg via ORAL
  Administered 2016-08-25: 650 mg via ORAL
  Filled 2016-08-21 (×4): qty 2

## 2016-08-21 MED ORDER — SODIUM CHLORIDE 0.9% FLUSH: 3.0000 mL | INTRAVENOUS | Status: DC | PRN

## 2016-08-21 MED ORDER — ADULT MULTIVITAMIN W/MINERALS CH
1.0000 | ORAL_TABLET | Freq: Every day | ORAL | Status: DC
  Administered 2016-08-22 – 2016-08-25 (×4): 1 via ORAL
  Filled 2016-08-21 (×4): qty 1

## 2016-08-21 MED ORDER — ROSUVASTATIN CALCIUM 10 MG PO TABS
5.0000 mg | ORAL_TABLET | Freq: Every day | ORAL | Status: DC
  Administered 2016-08-22 – 2016-08-24 (×4): 5 mg via ORAL
  Filled 2016-08-21 (×4): qty 1

## 2016-08-21 MED ORDER — ACETAMINOPHEN 650 MG RE SUPP: 650.0000 mg | Freq: Four times a day (QID) | RECTAL | Status: DC | PRN

## 2016-08-21 MED ORDER — VANCOMYCIN HCL IN DEXTROSE 1-5 GM/200ML-% IV SOLN
1000.0000 mg | Freq: Once | INTRAVENOUS | Status: AC
  Administered 2016-08-21: 1000 mg via INTRAVENOUS
  Filled 2016-08-21: qty 200

## 2016-08-21 MED ORDER — FLUTICASONE PROPIONATE 50 MCG/ACT NA SUSP
1.0000 | Freq: Every day | NASAL | Status: DC
  Administered 2016-08-22 – 2016-08-25 (×4): 1 via NASAL
  Filled 2016-08-21: qty 16

## 2016-08-21 MED ORDER — VANCOMYCIN HCL IN DEXTROSE 1-5 GM/200ML-% IV SOLN
1000.0000 mg | INTRAVENOUS | Status: DC
  Administered 2016-08-22 – 2016-08-24 (×3): 1000 mg via INTRAVENOUS
  Filled 2016-08-21 (×3): qty 200

## 2016-08-21 MED ORDER — SODIUM CHLORIDE 0.9% FLUSH
3.0000 mL | Freq: Two times a day (BID) | INTRAVENOUS | Status: DC
  Administered 2016-08-22 – 2016-08-24 (×6): 3 mL via INTRAVENOUS

## 2016-08-21 MED ORDER — FUROSEMIDE 40 MG PO TABS
40.0000 mg | ORAL_TABLET | Freq: Every day | ORAL | Status: DC
  Administered 2016-08-22 – 2016-08-25 (×4): 40 mg via ORAL
  Filled 2016-08-21 (×4): qty 1

## 2016-08-21 MED ORDER — GABAPENTIN 300 MG PO CAPS
300.0000 mg | ORAL_CAPSULE | Freq: Every day | ORAL | Status: DC
  Administered 2016-08-22 – 2016-08-25 (×4): 300 mg via ORAL
  Filled 2016-08-21 (×4): qty 1

## 2016-08-21 MED ORDER — SPIRONOLACTONE 25 MG PO TABS
50.0000 mg | ORAL_TABLET | Freq: Every day | ORAL | Status: DC
  Administered 2016-08-22 – 2016-08-25 (×4): 50 mg via ORAL
  Filled 2016-08-21 (×4): qty 2

## 2016-08-21 NOTE — ED Provider Notes (Signed)
[facility] Emergency Department Provider Note   ____________________________________________   First MD Initiated Contact with Patient 08/21/16 1743     (approximate)  I have reviewed the triage vital signs and the nursing notes.   HISTORY  Chief Complaint Cellulitis and Wound Check    HPI Sean Jensen. is a 83 y.o. male who reportedly hit the side of his right leg on bed about 3 days ago. The area is now red swollen hot tender and is oozing a little bit of serosanguineous fluid. He reports is very painful there. He does not report any fever.  Past Medical History:  Diagnosis Date  . Cancer ([place])    skin cancer  . CHF (congestive heart failure) ([place])   . Cirrhosis [facility])     Patient Active Problem List   Diagnosis Date Noted  . Mixed hyperlipidemia 12/18/2015  . Essential hypertension 12/18/2015  . CAD (coronary artery disease) 12/18/2015  . Arthritis 12/18/2015  . Acute on chronic diastolic CHF (congestive heart failure), NYHA class 3 ([place]) 12/12/2015  . Cirrhosis of liver with ascites ([place]) 03/26/2014  . Shortness of breath 08/24/2013    Past Surgical History:  Procedure Laterality Date  . cardiac bypass    . CARDIAC SURGERY     quadruple bypass  . JOINT REPLACEMENT Right    knee    Prior to Admission medications   Medication Sig Start Date End Date Taking? Authorizing Provider  acetaminophen (TYLENOL) 650 MG CR tablet Take by mouth.    [provider]  Ascorbic Acid (VITAMIN C) 1000 MG tablet Take by mouth.    [provider]  aspirin EC 81 MG tablet Take 81 mg by mouth. 03/29/10   [provider]  Cyanocobalamin (RA VITAMIN B-12 TR) 1000 MCG TBCR Take by mouth.    [provider]  doxazosin (CARDURA) 4 MG tablet  12/07/15   [provider]  fexofenadine (ALLEGRA) 180 MG tablet Take by mouth. 07/21/13   [provider]  fluticasone (FLONASE) 50 MCG/ACT nasal spray USE ONE  SPRAY IN EACH NOSTRIL EVERY DAY 10/30/15   [provider]  furosemide (LASIX) 20 MG tablet Take 60mg  every morning and 40mg  in the afternoon at 4:00pm 04/10/16   Lucilla Lame, MD  furosemide (LASIX) 40 MG tablet Take 3 tablets daily (60mg  total) 02/20/16   Lucilla Lame, MD  gabapentin (NEURONTIN) 300 MG capsule Take by mouth.    [provider]  Multiple Vitamin (MULTI-VITAMINS) TABS Take by mouth.    [provider]  rosuvastatin (CRESTOR) 5 MG tablet Take by mouth.    [provider]  spironolactone (ALDACTONE) 50 MG tablet Take 1 tablet (50 mg total) by mouth daily. 02/19/16   Lucilla Lame, MD  vitamin B-12 (CYANOCOBALAMIN) 1000 MCG tablet Take by mouth.    [provider]    Allergies Hydrocodone  Family History  Problem Relation Age of Onset  . Heart disease Father     Social History Social History  Substance Use Topics  . Smoking status: Never Smoker  . Smokeless tobacco: Never Used  . Alcohol use No    Review of Systems  Constitutional: No fever/chills Eyes: No visual changes. ENT: No sore throat. Cardiovascular: Denies chest pain. Respiratory: DeniesAny change in baseline shortness of breath. Gastrointestinal: No abdominal pain.  No nausea, no vomiting.  No diarrhea.  No constipation. Genitourinary: Negative for dysuria. Musculoskeletal: Negative for back pain. Skin: Negative for rash except for redness  warmth and swelling in the right leg. Neurological: Negative for headaches, focal weakness or numbness.  ____________________________________________   PHYSICAL EXAM:  VITAL SIGNS: ED Triage Vitals  Enc Vitals Group     BP 08/21/16 1710 (!) 131/59     Pulse Rate 08/21/16 1710 82     Resp 08/21/16 1710 18     Temp 08/21/16 1710 99.2 F (37.3 C)     Temp Source 08/21/16 1710 Oral     SpO2 08/21/16 1710 93 %     Weight 08/21/16 1714 135 lb (61.2 kg)     Height 08/21/16 1714 5\' 6"  (1.676 m)     Head Circumference --       Peak Flow --      Pain Score --      Pain Loc --      Pain Edu? --      Excl. in [place]? --     Constitutional: Alert and oriented. Well appearing and in no acute distress. Eyes: Conjunctivae are normal. Head: Atraumatic. Nose: No congestion/rhinnorhea. Mouth/Throat: Mucous membranes are moist.  Oropharynx non-erythematous. Neck: No stridor.  Cardiovascular: Normal rate, regular rhythm. Grossly normal heart sounds.  Good peripheral circulation. Respiratory: Normal respiratory effort.  No retractions. Lungs CTAB. Gastrointestinal: Soft and nontender. No distention. No abdominal bruits. No CVA tenderness. Musculoskeletal: Right leg is red swollen warm and tender as described in history of present illness. Left leg has a little bit of edema. ns. Neurologic:  Normal speech and language. No gross focal neurologic deficits are appreciated.  Skin:  Skin is warm, dry and intact. No rash noted. Except as noted above   ____________________________________________   LABS (all labs ordered are listed, but only abnormal results are displayed)  Labs Reviewed  COMPREHENSIVE METABOLIC PANEL - Abnormal; Notable for the following:       Result Value   Sodium 129 (*)    Chloride 95 (*)    Glucose, Bld 113 (*)    BUN 34 (*)    Calcium 8.6 (*)    Albumin 3.0 (*)    AST 46 (*)    Alkaline Phosphatase 267 (*)    Total Bilirubin 2.8 (*)    GFR calc non Af Amer 56 (*)    All other components within normal limits  CBC WITH DIFFERENTIAL/PLATELET - Abnormal; Notable for the following:    RBC 3.63 (*)    Hemoglobin 11.8 (*)    HCT 34.1 (*)    RDW 15.1 (*)    Platelets 68 (*)    Neutro Abs 8.5 (*)    Lymphs Abs 0.4 (*)    All other components within normal limits  CULTURE, BLOOD (ROUTINE X 2)  CULTURE, BLOOD (ROUTINE X 2)   ____________________________________________  EKG   ____________________________________________  RADIOLOGY Ultrasound shows no evidence of  DVT ____________________________________________   PROCEDURES  Procedure(s) performed:   Procedures  Critical Care performed:   ____________________________________________   INITIAL IMPRESSION / ASSESSMENT AND PLAN / ED COURSE  Pertinent labs & imaging results that were available during my care of the patient were reviewed by me and considered in my medical decision making (see chart for details).          ____________________________________________   FINAL CLINICAL IMPRESSION(S) / ED DIAGNOSES  Final diagnoses:  Cellulitis of right lower extremity      NEW MEDICATIONS STARTED DURING THIS VISIT:  New Prescriptions   No medications on file     Note:  This document was  prepared using Systems analyst and may include unintentional dictation errors.    Nena Polio, MD 08/21/16 [phone]

## 2016-08-21 NOTE — Progress Notes (Signed)
Pharmacy Antibiotic Note  Sean Jensen. is a 83 y.o. male admitted on 08/21/2016 with cellulitis.  Pharmacy has been consulted for ampicillin/sulbactam dosing.  Plan: Ampicillin/sulbactam 3 g IV received in ED.  Will order ampicillin/sulbactam 1.5 g IV q6h  Height: 5\' 6"  (167.6 cm) Weight: 135 lb (61.2 kg) IBW/kg (Calculated) : 63.8  Temp (24hrs), Avg:99.2 F (37.3 C), Min:99.2 F (37.3 C), Max:99.2 F (37.3 C)   Recent Labs Lab 08/21/16 1752  WBC 9.9  CREATININE 1.10    Estimated Creatinine Clearance: 37.1 mL/min (by C-G formula based on SCr of 1.1 mg/dL).    Allergies  Allergen Reactions  . Hydrocodone Other (See Comments)    Pt states this med and "all other narcotics" make him "crazy" and hallucinate.    Antimicrobials this admission: Ampicillin/sulbactam 7/26 >>  Vancomycin dose in ED 7/26  Dose adjustments this admission:  Microbiology results: 7/26 BCx: Sent  Thank you for allowing pharmacy to be a part of this patient's care.  Lenis Noon, PharmD Clinical Pharmacist 08/21/2016 7:26 PM

## 2016-08-21 NOTE — Progress Notes (Signed)
Pharmacy Antibiotic Note  Manolito Jurewicz Dyke Weible. is a 83 y.o. male admitted on 08/21/2016 with cellulitis.  Pharmacy has been consulted for vancomycin and Unasyn dosing.  Plan: DW 70kg  Vd 49L kei 0.037 hr-1  T1/2 19 hours Vancomycin 1 gram q 24 hours ordered with stacked dosing. Level before 5th dose. Goal trough 15-20.  Unasyn 3 grams q 6 hours ordered.  Height: 5\' 6"  (167.6 cm) Weight: 155 lb 1.6 oz (70.4 kg) IBW/kg (Calculated) : 63.8  Temp (24hrs), Avg:98.5 F (36.9 C), Min:97.7 F (36.5 C), Max:99.2 F (37.3 C)   Recent Labs Lab 08/21/16 1752  WBC 9.9  CREATININE 1.10    Estimated Creatinine Clearance: 38.7 mL/min (by C-G formula based on SCr of 1.1 mg/dL).    Allergies  Allergen Reactions  . Hydrocodone Other (See Comments)    Pt states this med and "all other narcotics" make him "crazy" and hallucinate.    Antimicrobials this admission: vancomycin Unasyn >> 7/26   >>   Dose adjustments this admission:   Microbiology results: 7/26 BCx: pending    Thank you for allowing pharmacy to be a part of this patient's care.  Chatara Lucente S 08/21/2016 11:34 PM

## 2016-08-21 NOTE — H&P (Signed)
Sean Jensen. is an 83 y.o. male.   Chief Complaint: Right leg pain and swelling HPI: This is a 83 year old male who 3 days ago and bumped his right leg and toward the skin, helping his son move some furniture. Since then the legs become more swollen and erythematous. They went to urgent care today and were sent over to the hospital. Denies any chills or fever.  Past Medical History:  Diagnosis Date  . Cancer ([place])    skin cancer  . CHF (congestive heart failure) ([place])   . Cirrhosis [facility])     Past Surgical History:  Procedure Laterality Date  . cardiac bypass    . CARDIAC SURGERY     quadruple bypass  . JOINT REPLACEMENT Right    knee    Family History  Problem Relation Age of Onset  . Heart disease Father    Social History:  reports that he has never smoked. He has never used smokeless tobacco. He reports that he does not drink alcohol or use drugs.  Allergies:  Allergies  Allergen Reactions  . Hydrocodone Other (See Comments)    Pt states this med and "all other narcotics" make him "crazy" and hallucinate.     (Not in a hospital admission)  Results for orders placed or performed during the hospital encounter of 08/21/16 (from the past 48 hour(s))  Comprehensive metabolic panel     Status: Abnormal   Collection Time: 08/21/16  5:52 PM  Result Value Ref Range   Sodium 129 (L) 135 - 145 mmol/L   Potassium 4.1 3.5 - 5.1 mmol/L   Chloride 95 (L) 101 - 111 mmol/L   CO2 24 22 - 32 mmol/L   Glucose, Bld 113 (H) 65 - 99 mg/dL   BUN 34 (H) 6 - 20 mg/dL   Creatinine, Ser 1.10 0.61 - 1.24 mg/dL   Calcium 8.6 (L) 8.9 - 10.3 mg/dL   Total Protein 6.7 6.5 - 8.1 g/dL   Albumin 3.0 (L) 3.5 - 5.0 g/dL   AST 46 (H) 15 - 41 U/L   ALT 27 17 - 63 U/L   Alkaline Phosphatase 267 (H) 38 - 126 U/L   Total Bilirubin 2.8 (H) 0.3 - 1.2 mg/dL   GFR calc non Af Amer 56 (L) >60 mL/min   GFR calc Af Amer >60 >60 mL/min    Comment: (NOTE) The eGFR has been calculated using the CKD EPI  equation. This calculation has not been validated in all clinical situations. eGFR's persistently <60 mL/min signify possible Chronic Kidney Disease.    Anion gap 10 5 - 15  CBC with Differential     Status: Abnormal   Collection Time: 08/21/16  5:52 PM  Result Value Ref Range   WBC 9.9 3.8 - 10.6 K/uL   RBC 3.63 (L) 4.40 - 5.90 MIL/uL   Hemoglobin 11.8 (L) 13.0 - 18.0 g/dL   HCT 34.1 (L) 40.0 - 52.0 %   MCV 93.8 80.0 - 100.0 fL   MCH 32.4 26.0 - 34.0 pg   MCHC 34.6 32.0 - 36.0 g/dL   RDW 15.1 (H) 11.5 - 14.5 %   Platelets 68 (L) 150 - 440 K/uL   Neutrophils Relative % 85 %   Neutro Abs 8.5 (H) 1.4 - 6.5 K/uL   Lymphocytes Relative 4 %   Lymphs Abs 0.4 (L) 1.0 - 3.6 K/uL   Monocytes Relative 11 %   Monocytes Absolute 1.0 0.2 - 1.0 K/uL   Eosinophils Relative  0 %   Eosinophils Absolute 0.0 0 - 0.7 K/uL   Basophils Relative 0 %   Basophils Absolute 0.0 0 - 0.1 K/uL   US Venous Img Lower Unilateral Right  Result Date: 08/21/2016 CLINICAL DATA:  Right lower extremity swelling for 3 days EXAM: RIGHT LOWER EXTREMITY VENOUS DOPPLER ULTRASOUND TECHNIQUE: Gray-scale sonography with graded compression, as well as color Doppler and duplex ultrasound were performed to evaluate the lower extremity deep venous systems from the level of the common femoral vein and including the common femoral, femoral, profunda femoral, popliteal and calf veins including the posterior tibial, peroneal and gastrocnemius veins when visible. The superficial great saphenous vein was also interrogated. Spectral Doppler was utilized to evaluate flow at rest and with distal augmentation maneuvers in the common femoral, femoral and popliteal veins. COMPARISON:  None. FINDINGS: Contralateral Common Femoral Vein: Respiratory phasicity is normal and symmetric with the symptomatic side. No evidence of thrombus. Normal compressibility. Common Femoral Vein: No evidence of thrombus. Normal compressibility, respiratory phasicity and  response to augmentation. Saphenofemoral Junction: No evidence of thrombus. Normal compressibility and flow on color Doppler imaging. Profunda Femoral Vein: No evidence of thrombus. Normal compressibility and flow on color Doppler imaging. Femoral Vein: No evidence of thrombus. Normal compressibility, respiratory phasicity and response to augmentation. Popliteal Vein: No evidence of thrombus. Normal compressibility, respiratory phasicity and response to augmentation. Calf Veins: No evidence of thrombus. Normal compressibility and flow on color Doppler imaging. Right peroneal vein is not well seen. Superficial Great Saphenous Vein: No evidence of thrombus. Normal compressibility and flow on color Doppler imaging. Venous Reflux:  None. Other Findings:  None. IMPRESSION: No evidence of DVT within the right lower extremity. Electronically Signed   By: Abelardo Diesel M.D.   On: 08/21/2016 18:36    Review of Systems  Constitutional: Negative for fever.  HENT: Negative for hearing loss.   Eyes: Negative for blurred vision.  Respiratory: Negative for cough.   Cardiovascular: Negative for chest pain.  Gastrointestinal: Negative for nausea and vomiting.  Genitourinary: Negative for dysuria.  Musculoskeletal: Negative for back pain.  Skin: Negative for rash.  Neurological: Negative for dizziness.    Blood pressure (!) 107/48, pulse 72, temperature 99.2 F (37.3 C), temperature source Oral, resp. rate 18, height _0  (1.676 m), weight 61.2 kg (135 lb), SpO2 95 %. Physical Exam  Constitutional: He is oriented to person, place, and time. He appears well-developed and well-nourished. No distress.  HENT:  Head: Normocephalic and atraumatic.  Mouth/Throat: Oropharynx is clear and moist. No oropharyngeal exudate.  Eyes: Pupils are equal, round, and reactive to light. No scleral icterus.  Neck: Neck supple. No JVD present. No tracheal deviation present. No thyromegaly present.  Cardiovascular: Normal rate and  regular rhythm.   No murmur heard. Respiratory: Breath sounds normal. No respiratory distress. He exhibits no tenderness.  GI: Soft. Bowel sounds are normal. He exhibits distension.  Distended abdomen with positive fluid wave. Large umbilical hernia.  Musculoskeletal: Normal range of motion. He exhibits no edema or tenderness.  Lymphadenopathy:    He has no cervical adenopathy.  Neurological: He is alert and oriented to person, place, and time. No cranial nerve deficit.  Skin: Skin is warm and dry.     Assessment/Plan 1. Cellulitis right lower extremity. Likely secondary to the skin tear. This erythematous all up to his thigh. We'll go ahead and start IV antibiotics and get cultures. 2. Ascites. Patient has a history of nonalcoholic cirrhosis. He has a history  of ascites and periodically gets paracentesis. His ascites is not called and he is on Lasix and spironolactone. We'll monitor this for now. 3. Nonalcoholic cirrhosis. This is being managed as an outpatient. He does have ascites as above. 4. Coronary artery disease. This appears to be stable CONTINUE his current medications 5. CODE STATUS. Discussed this with patient and his 2 children who have healthcare power of attorney. For now he wishes to remain full code.  Total time spent 45 minutes  Baxter Hire, MD 08/21/2016, 9:26 PM

## 2016-08-21 NOTE — ED Notes (Signed)
Pt to US.

## 2016-08-21 NOTE — ED Notes (Signed)
Pharmacy called for vancomycin.

## 2016-08-21 NOTE — ED Triage Notes (Signed)
Pt reports hit his leg on the side of the bed three days ago. Pt presents with open wound to right lower leg, redness and swelling to right lower leg. Pt states it does not hurt as long as he is sitting still.

## 2016-08-22 ENCOUNTER — Encounter: Payer: Self-pay | Admitting: General Practice

## 2016-08-22 ENCOUNTER — Inpatient Hospital Stay: Payer: Medicare Other

## 2016-08-22 LAB — BLOOD CULTURE ID PANEL (REFLEXED)
ACINETOBACTER BAUMANNII: NOT DETECTED
CANDIDA KRUSEI: NOT DETECTED
CANDIDA PARAPSILOSIS: NOT DETECTED
Candida albicans: NOT DETECTED
Candida glabrata: NOT DETECTED
Candida tropicalis: NOT DETECTED
ENTEROCOCCUS SPECIES: NOT DETECTED
ESCHERICHIA COLI: NOT DETECTED
Enterobacter cloacae complex: NOT DETECTED
Enterobacteriaceae species: NOT DETECTED
HAEMOPHILUS INFLUENZAE: NOT DETECTED
Klebsiella oxytoca: NOT DETECTED
Klebsiella pneumoniae: NOT DETECTED
LISTERIA MONOCYTOGENES: NOT DETECTED
METHICILLIN RESISTANCE: DETECTED — AB
Neisseria meningitidis: NOT DETECTED
PROTEUS SPECIES: NOT DETECTED
Pseudomonas aeruginosa: NOT DETECTED
SERRATIA MARCESCENS: NOT DETECTED
STAPHYLOCOCCUS AUREUS BCID: NOT DETECTED
STAPHYLOCOCCUS SPECIES: DETECTED — AB
STREPTOCOCCUS AGALACTIAE: NOT DETECTED
Streptococcus pneumoniae: NOT DETECTED
Streptococcus pyogenes: NOT DETECTED
Streptococcus species: NOT DETECTED

## 2016-08-22 LAB — BODY FLUID CELL COUNT WITH DIFFERENTIAL
Lymphs, Fluid: 66 %
Monocyte-Macrophage-Serous Fluid: 27 %
Neutrophil Count, Fluid: 7 %
Total Nucleated Cell Count, Fluid: 191 cu mm

## 2016-08-22 LAB — CBC
HCT: 30 % — ABNORMAL LOW (ref 40.0–52.0)
Hemoglobin: 10.5 g/dL — ABNORMAL LOW (ref 13.0–18.0)
MCH: 33.1 pg (ref 26.0–34.0)
MCHC: 35 g/dL (ref 32.0–36.0)
MCV: 94.6 fL (ref 80.0–100.0)
PLATELETS: 61 10*3/uL — AB (ref 150–440)
RBC: 3.17 MIL/uL — ABNORMAL LOW (ref 4.40–5.90)
RDW: 15.2 % — ABNORMAL HIGH (ref 11.5–14.5)
WBC: 6.7 10*3/uL (ref 3.8–10.6)

## 2016-08-22 LAB — BASIC METABOLIC PANEL
Anion gap: 9 (ref 5–15)
BUN: 30 mg/dL — AB (ref 6–20)
CALCIUM: 8.2 mg/dL — AB (ref 8.9–10.3)
CHLORIDE: 96 mmol/L — AB (ref 101–111)
CO2: 25 mmol/L (ref 22–32)
CREATININE: 1.08 mg/dL (ref 0.61–1.24)
GFR calc non Af Amer: 57 mL/min — ABNORMAL LOW (ref >60)
GLUCOSE: 98 mg/dL (ref 65–99)
Potassium: 3.7 mmol/L (ref 3.5–5.1)
Sodium: 130 mmol/L — ABNORMAL LOW (ref 135–145)

## 2016-08-22 LAB — GLUCOSE, PLEURAL OR PERITONEAL FLUID: GLUCOSE FL: 133 mg/dL

## 2016-08-22 LAB — ALBUMIN, PLEURAL OR PERITONEAL FLUID: Albumin, Fluid: <1 g/dL

## 2016-08-22 LAB — PROTEIN, PLEURAL OR PERITONEAL FLUID

## 2016-08-22 LAB — AMYLASE, PLEURAL OR PERITONEAL FLUID: Amylase, Fluid: 8 U/L

## 2016-08-22 MED ORDER — IPRATROPIUM-ALBUTEROL 0.5-2.5 (3) MG/3ML IN SOLN
3.0000 mL | Freq: Four times a day (QID) | RESPIRATORY_TRACT | Status: DC
  Administered 2016-08-22 – 2016-08-23 (×3): 3 mL via RESPIRATORY_TRACT
  Filled 2016-08-22 (×3): qty 3

## 2016-08-22 NOTE — Progress Notes (Signed)
[facility] [place] NAME: Sean Jensen    MR#:  [id number]  DATE OF BIRTH:  1921-06-12  SUBJECTIVE:  CHIEF COMPLAINT:   Chief Complaint  Patient presents with  . Cellulitis  . Wound Check   -Frail appearing elderly male, still has significant right lower extremity swelling and redness. -Also having trouble with cough, shortness of breath he had also complains of back pain  REVIEW OF SYSTEMS:  Review of Systems  Constitutional: Positive for malaise/fatigue. Negative for chills and fever.  HENT: Positive for hearing loss. Negative for congestion and nosebleeds.   Eyes: Negative for blurred vision and double vision.  Respiratory: Positive for cough, shortness of breath and wheezing.   Cardiovascular: Positive for leg swelling. Negative for chest pain and palpitations.  Gastrointestinal: Negative for abdominal pain, constipation, diarrhea, nausea and vomiting.  Genitourinary: Negative for dysuria.  Musculoskeletal: Positive for back pain and myalgias.  Skin: Positive for rash.  Neurological: Negative for dizziness, sensory change, speech change, focal weakness, seizures and headaches.  Psychiatric/Behavioral: Negative for depression.    DRUG ALLERGIES:   Allergies  Allergen Reactions  . Hydrocodone Other (See Comments)    Pt states this med and "all other narcotics" make him "crazy" and hallucinate.    VITALS:  Blood pressure (!) 123/43, pulse 62, temperature 99.8 F (37.7 C), temperature source Oral, resp. rate 20, height 5\' 6"  (1.676 m), weight 70.4 kg (155 lb 1.6 oz), SpO2 95 %.  PHYSICAL EXAMINATION:  Physical Exam  GENERAL:  83 y.o.-year-old Elderly patient lying in the bed with no acute distress.  EYES: Pupils equal, round, reactive to light and accommodation. No scleral icterus. Extraocular muscles intact.  HEENT: Head atraumatic, normocephalic. Oropharynx and nasopharynx clear.  NECK:  Supple, no jugular venous  distention. No thyroid enlargement, no tenderness.  LUNGS: Moving air bilaterally though has scattered wheezing especially at the bases. No rales or rhonchi. Not using accessory muscles to breathe.  CARDIOVASCULAR: S1, S2 normal. No  rubs, or gallops. 3/6 systolic murmur present ABDOMEN: Soft but distended, nontender. Bowel sounds present. No organomegaly or mass.  EXTREMITIES: No  cyanosis, or clubbing. Left leg has no edema, right lower extremity is very erythematous especially on the lateral side, significant swelling noted from the foot to the knee. There is an open laceration with minimal discharge noted NEUROLOGIC: Cranial nerves II through XII are intact. Muscle strength 5/5 in all extremities. Sensation intact. Gait not checked. Global weakness present PSYCHIATRIC: The patient is alert and oriented x 3.  SKIN: No obvious rash, lesion, or ulcer.    LABORATORY PANEL:   CBC  Recent Labs Lab 08/22/16 0504  WBC 6.7  HGB 10.5*  HCT 30.0*  PLT 61*   ------------------------------------------------------------------------------------------------------------------  Chemistries   Recent Labs Lab 08/21/16 1752 08/22/16 0504  NA 129* 130*  K 4.1 3.7  CL 95* 96*  CO2 24 25  GLUCOSE 113* 98  BUN 34* 30*  CREATININE 1.10 1.08  CALCIUM 8.6* 8.2*  AST 46*  --   ALT 27  --   ALKPHOS 267*  --   BILITOT 2.8*  --    ------------------------------------------------------------------------------------------------------------------  Cardiac Enzymes No results for input(s): TROPONINI in the last 168 hours. ------------------------------------------------------------------------------------------------------------------  RADIOLOGY:  US Venous Img Lower Unilateral Right  Result Date: 08/21/2016 CLINICAL DATA:  Right lower extremity swelling for 3 days EXAM: RIGHT LOWER EXTREMITY VENOUS DOPPLER ULTRASOUND TECHNIQUE: Gray-scale sonography with graded compression, as well as color  Doppler and duplex ultrasound were performed to evaluate the lower extremity deep venous systems from the level of the common femoral vein and including the common femoral, femoral, profunda femoral, popliteal and calf veins including the posterior tibial, peroneal and gastrocnemius veins when visible. The superficial great saphenous vein was also interrogated. Spectral Doppler was utilized to evaluate flow at rest and with distal augmentation maneuvers in the common femoral, femoral and popliteal veins. COMPARISON:  None. FINDINGS: Contralateral Common Femoral Vein: Respiratory phasicity is normal and symmetric with the symptomatic side. No evidence of thrombus. Normal compressibility. Common Femoral Vein: No evidence of thrombus. Normal compressibility, respiratory phasicity and response to augmentation. Saphenofemoral Junction: No evidence of thrombus. Normal compressibility and flow on color Doppler imaging. Profunda Femoral Vein: No evidence of thrombus. Normal compressibility and flow on color Doppler imaging. Femoral Vein: No evidence of thrombus. Normal compressibility, respiratory phasicity and response to augmentation. Popliteal Vein: No evidence of thrombus. Normal compressibility, respiratory phasicity and response to augmentation. Calf Veins: No evidence of thrombus. Normal compressibility and flow on color Doppler imaging. Right peroneal vein is not well seen. Superficial Great Saphenous Vein: No evidence of thrombus. Normal compressibility and flow on color Doppler imaging. Venous Reflux:  None. Other Findings:  None. IMPRESSION: No evidence of DVT within the right lower extremity. Electronically Signed   By: Abelardo Diesel M.D.   On: 08/21/2016 18:36    EKG:   Orders placed or performed in visit on 12/31/13  . EKG 12-Lead  . EKG 12-Lead    ASSESSMENT AND PLAN:   83 year old male with past medical history significant for liver cirrhosis, congestive heart failure, CAD status post CABG,  arthritis presents to hospital secondary to worsening right lower extremity swelling.  #1 right lower extremity cellulitis-started after trauma, there is an open laceration present. -Keep the leg elevated at least on 2 pillows. -Follow blood cultures. Continue vancomycin and Unasyn at this time.  #2 dyspnea with wheezing-we'll get a stat chest x-ray. Due to history of CHF especially -Breathing treatments ordered. O2 supplementation if needed -Needed thoracentesis in the past, so watch out for pleural effusion.  #3 liver cirrhosis with ascites-complaining of nausea and abdominal distention. -Ordered paracentesis -Continue Lasix and Aldactone  #4 thrombocytopenia-chronic secondary to liver disease. No indication for transfusion. No active bleeding. Monitor at this time  #5 DVT prophylaxis-we will do Ted's and SCDs.   All the records are reviewed and case discussed with Care Management/Social Workerr. Management plans discussed with the patient, family and they are in agreement.  CODE STATUS: Full code  TOTAL TIME TAKING CARE OF THIS PATIENT: 37 minutes.   POSSIBLE D/C IN 2-3 DAYS, DEPENDING ON CLINICAL CONDITION.   Karron Goens M.D on 08/22/2016 at 1:56 PM  Between 7am to 6pm - Pager - [phone]  After 6pm go to [email or web address] - password [email or web address] [facility]  Office  [phone]  CC: Primary care physician; [place] Pink, MD

## 2016-08-22 NOTE — Progress Notes (Signed)
PHARMACY - PHYSICIAN COMMUNICATION CRITICAL VALUE ALERT - BLOOD CULTURE IDENTIFICATION (BCID)  Results for orders placed or performed during the hospital encounter of 08/21/16  Blood Culture ID Panel (Reflexed) (Collected: 08/21/2016  6:36 PM)  Result Value Ref Range   Enterococcus species NOT DETECTED NOT DETECTED   Listeria monocytogenes NOT DETECTED NOT DETECTED   Staphylococcus species DETECTED (A) NOT DETECTED   Staphylococcus aureus NOT DETECTED NOT DETECTED   Methicillin resistance DETECTED (A) NOT DETECTED   Streptococcus species NOT DETECTED NOT DETECTED   Streptococcus agalactiae NOT DETECTED NOT DETECTED   Streptococcus pneumoniae NOT DETECTED NOT DETECTED   Streptococcus pyogenes NOT DETECTED NOT DETECTED   Acinetobacter baumannii NOT DETECTED NOT DETECTED   Enterobacteriaceae species NOT DETECTED NOT DETECTED   Enterobacter cloacae complex NOT DETECTED NOT DETECTED   Escherichia coli NOT DETECTED NOT DETECTED   Klebsiella oxytoca NOT DETECTED NOT DETECTED   Klebsiella pneumoniae NOT DETECTED NOT DETECTED   Proteus species NOT DETECTED NOT DETECTED   Serratia marcescens NOT DETECTED NOT DETECTED   Haemophilus influenzae NOT DETECTED NOT DETECTED   Neisseria meningitidis NOT DETECTED NOT DETECTED   Pseudomonas aeruginosa NOT DETECTED NOT DETECTED   Candida albicans NOT DETECTED NOT DETECTED   Candida glabrata NOT DETECTED NOT DETECTED   Candida krusei NOT DETECTED NOT DETECTED   Candida parapsilosis NOT DETECTED NOT DETECTED   Candida tropicalis NOT DETECTED NOT DETECTED    Name of physician (or Provider) Contacted: Harrel Lemon    Changes to prescribed antibiotics required: None, keep pt on current regimen.   Shawnta Schlegel D 08/22/2016  6:37 PM

## 2016-08-22 NOTE — Procedures (Signed)
Ultrasound-guided diagnostic and therapeutic paracentesis performed yielding 3.6 liters of yellow  fluid. No immediate complications. A portion of the fluid was submitted to the laboratory for preordered studies.

## 2016-08-22 NOTE — Evaluation (Signed)
Clinical/Bedside Swallow Evaluation Patient Details  Name: Sean Jensen. MRN: [id number] Date of Birth: [birth year]  Today's Date: 08/22/2016 Time: SLP Start Time (ACUTE ONLY): 1246 SLP Stop Time (ACUTE ONLY): 1315 SLP Time Calculation (min) (ACUTE ONLY): 29 min  Past Medical History:  Past Medical History:  Diagnosis Date  . Cancer ([place])    skin cancer  . CHF (congestive heart failure) ([place])   . Cirrhosis [facility])    Past Surgical History:  Past Surgical History:  Procedure Laterality Date  . cardiac bypass    . CARDIAC SURGERY     quadruple bypass  . JOINT REPLACEMENT Right    knee   HPI:      Assessment / Plan / Recommendation Clinical Impression  pt presents with a moderate oral pharyngeal dysphagia characterized by coughing with thin liquids via cup ans straw even with limited amounts of thin liquids. pt with no overt ssx aspiration with regular bolus trials or honey thick liquids. pt stated he enjoyed the thickened liquids. slp educated family present on need for thickened liquids as they stated it was likely CFH and not swallow. slp educated on ssx aspiration and risk of aspiration with thin liquids at this time. st recommends downgrade to regular with honey. SLP Visit Diagnosis: Dysphagia, oropharyngeal phase (R13.12)    Aspiration Risk  Moderate aspiration risk    Diet Recommendation Regular;Honey-thick liquid   Liquid Administration via: Cup;Spoon Medication Administration: Whole meds with puree Supervision: Staff to assist with self feeding Compensations: Slow rate;Minimize environmental distractions;Small sips/bites;Follow solids with liquid Postural Changes: Seated upright at 90 degrees    Other  Recommendations Oral Care Recommendations: Oral care BID   Follow up Recommendations        Frequency and Duration min 3x week  2 weeks       Prognosis Prognosis for Safe Diet Advancement: Good Barriers to Reach Goals: Cognitive deficits      Swallow  Study   General Date of Onset: 08/22/16 Type of Study: Bedside Swallow Evaluation Diet Prior to this Study: Regular;Thin liquids Temperature Spikes Noted: No Respiratory Status: Room air History of Recent Intubation: No Behavior/Cognition: Alert;Cooperative;Pleasant mood;Confused Oral Cavity Assessment: Within Functional Limits Oral Care Completed by SLP: No Oral Cavity - Dentition: Adequate natural dentition Vision: Functional for self-feeding Self-Feeding Abilities: Able to feed self Patient Positioning: Upright in bed Baseline Vocal Quality: Normal Volitional Cough: Weak Volitional Swallow: Able to elicit    Oral/Motor/Sensory Function Overall Oral Motor/Sensory Function: Within functional limits   Ice Chips Ice chips: Not tested   Thin Liquid Thin Liquid: Impaired Oral Phase Impairments: Reduced lingual movement/coordination Pharyngeal  Phase Impairments: Suspected delayed Swallow;Wet Vocal Quality;Throat Clearing - Immediate;Cough - Immediate    Nectar Thick Nectar Thick Liquid: Not tested   Honey Thick Honey Thick Liquid: Within functional limits Presentation: Cup   Puree Puree: Within functional limits Presentation: Spoon   Solid   GO   Solid: Within functional limits Presentation: Hilario Quarry [facility] 08/22/2016,3:03 PM

## 2016-08-23 ENCOUNTER — Inpatient Hospital Stay: Payer: Medicare Other

## 2016-08-23 LAB — BASIC METABOLIC PANEL
ANION GAP: 8 (ref 5–15)
BUN: 29 mg/dL — AB (ref 6–20)
CALCIUM: 8.1 mg/dL — AB (ref 8.9–10.3)
CO2: 26 mmol/L (ref 22–32)
Chloride: 96 mmol/L — ABNORMAL LOW (ref 101–111)
Creatinine, Ser: 0.98 mg/dL (ref 0.61–1.24)
GFR calc Af Amer: >60 mL/min (ref >60)
GLUCOSE: 121 mg/dL — AB (ref 65–99)
Potassium: 3.6 mmol/L (ref 3.5–5.1)
SODIUM: 130 mmol/L — AB (ref 135–145)

## 2016-08-23 LAB — MISC LABCORP TEST (SEND OUT): Labcorp test code: 19588

## 2016-08-23 LAB — PATHOLOGIST SMEAR REVIEW

## 2016-08-23 MED ORDER — IPRATROPIUM-ALBUTEROL 0.5-2.5 (3) MG/3ML IN SOLN
3.0000 mL | Freq: Four times a day (QID) | RESPIRATORY_TRACT | Status: DC | PRN
  Administered 2016-08-25: 3 mL via RESPIRATORY_TRACT
  Filled 2016-08-23: qty 3

## 2016-08-23 NOTE — Progress Notes (Signed)
Post-thoracentesis, BP 90/52, HR 67, 100% on RA. Mental status unchanged. HOH. MD notified. No new orders at this time.

## 2016-08-23 NOTE — Progress Notes (Signed)
Speech Language Pathology Dysphagia Treatment Patient Details Name: Sean Jensen. MRN: [id number] DOB: [birth year] Today's Date: 08/23/2016 Time: 4818-5631 SLP Time Calculation (min) (ACUTE ONLY): 24 min  Assessment / Plan / Recommendation Clinical Impression   pt continues to present with a moderate to severe oral pharyngeal dysphagia characterized by prolonged mastication, increased a-p transit and immediate and delayed coughing with thin liquids. Pt states he does not like the thickened water and had a cup of thin water with a straw present when slp entered the room, slp removed thin water and educated nursing on pt current diet. signage is in room  Stating current diet. slp educated pt on need for thickened liquids, pt verbally agreed to diet at this time.     Diet Recommendation    Regular with honey thick liquids   SLP Plan Continue with current plan of care   Pertinent Vitals/Pain Not reported   Swallowing Goals     General Behavior/Cognition: Alert;Cooperative;Pleasant mood;Confused Patient Positioning: Upright in bed Oral care provided: N/A  Oral Cavity - Oral Hygiene     Dysphagia Treatment Treatment Methods: Skilled observation;Upgraded PO texture trial;Patient/caregiver education Patient observed directly with PO's: Yes Type of PO's observed: Regular;Dysphagia 2 (chopped);Thin liquids;Nectar-thick liquids;Honey-thick liquids Feeding: Able to feed self Liquids provided via: Cup;Teaspoon;No straw Oral Phase Signs & Symptoms: Prolonged mastication;Prolonged bolus formation Pharyngeal Phase Signs & Symptoms: Immediate cough;Delayed cough;Wet vocal quality Type of cueing: Verbal Amount of cueing: Minimal   GO     [place] Sauber 08/23/2016, 11:58 AM

## 2016-08-23 NOTE — Progress Notes (Signed)
[place] [facility] [place] NAME: Sean Jensen    MR#:  [id number]  DATE OF BIRTH:  Feb 16, 1921  SUBJECTIVE:  CHIEF COMPLAINT:   Chief Complaint  Patient presents with  . Cellulitis  . Wound Check   -had paracentesis yesterday and almost 4L taken out, dyspnea persists though sats stable on room air - PT consult today  REVIEW OF SYSTEMS:  Review of Systems  Constitutional: Positive for malaise/fatigue. Negative for chills and fever.  HENT: Positive for hearing loss. Negative for congestion and nosebleeds.   Eyes: Negative for blurred vision and double vision.  Respiratory: Positive for cough and shortness of breath. Negative for wheezing.   Cardiovascular: Positive for leg swelling. Negative for chest pain and palpitations.  Gastrointestinal: Negative for abdominal pain, constipation, diarrhea, nausea and vomiting.  Genitourinary: Negative for dysuria.  Musculoskeletal: Positive for back pain and myalgias.  Skin: Positive for rash.  Neurological: Negative for dizziness, sensory change, speech change, focal weakness, seizures and headaches.  Psychiatric/Behavioral: Negative for depression.    DRUG ALLERGIES:   Allergies  Allergen Reactions  . Hydrocodone Other (See Comments)    Pt states this med and "all other narcotics" make him "crazy" and hallucinate.    VITALS:  Blood pressure (!) 119/50, pulse 79, temperature 98.6 F (37 C), temperature source Oral, resp. rate 18, height 5\' 6"  (1.676 m), weight 70.4 kg (155 lb 1.6 oz), SpO2 96 %.  PHYSICAL EXAMINATION:  Physical Exam  GENERAL:  83 y.o.-year-old Elderly patient lying in the bed with no acute distress.  EYES: Pupils equal, round, reactive to light and accommodation. No scleral icterus. Extraocular muscles intact.  HEENT: Head atraumatic, normocephalic. Oropharynx and nasopharynx clear.  NECK:  Supple, no jugular venous distention. No thyroid enlargement, no tenderness.  LUNGS:  Moving air bilaterally decreased at the bases especially at right base with some rhonchi and rales. Not using accessory muscles to breathe.  CARDIOVASCULAR: S1, S2 normal. No  rubs, or gallops. 3/6 systolic murmur present ABDOMEN: Soft but distended, nontender. Bowel sounds present. No organomegaly or mass.  EXTREMITIES: No  cyanosis, or clubbing. Left leg has no edema, right lower extremity is erythematous especially on the lateral side, improving swelling noted from the foot to the knee. There is an open laceration with no discharge noted NEUROLOGIC: Cranial nerves II through XII are intact. Muscle strength 5/5 in all extremities. Sensation intact. Gait not checked. Global weakness present PSYCHIATRIC: The patient is alert and oriented x 3.  SKIN: No obvious rash, lesion, or ulcer.    LABORATORY PANEL:   CBC  Recent Labs Lab 08/22/16 0504  WBC 6.7  HGB 10.5*  HCT 30.0*  PLT 61*   ------------------------------------------------------------------------------------------------------------------  Chemistries   Recent Labs Lab 08/21/16 1752  08/23/16 0359  NA 129*  < > 130*  K 4.1  < > 3.6  CL 95*  < > 96*  CO2 24  < > 26  GLUCOSE 113*  < > 121*  BUN 34*  < > 29*  CREATININE 1.10  < > 0.98  CALCIUM 8.6*  < > 8.1*  AST 46*  --   --   ALT 27  --   --   ALKPHOS 267*  --   --   BILITOT 2.8*  --   --   < > = values in this interval not displayed. ------------------------------------------------------------------------------------------------------------------  Cardiac Enzymes No results for input(s): TROPONINI in the last 168 hours. ------------------------------------------------------------------------------------------------------------------  RADIOLOGY:  Dg Chest 2 View  Result Date: 08/22/2016 CLINICAL DATA:  Recent paracentesis, shortness of Breath EXAM: CHEST  2 VIEW COMPARISON:  None. FINDINGS: Cardiac shadow is mildly enlarged. Postsurgical changes are seen.  Aortic calcifications are noted. Large right-sided pleural effusion is seen with only minimal aerated right upper lobe. The left lung is well aerated with mild atelectasis and small effusion. No acute bony abnormality is seen. IMPRESSION: Bilateral pleural effusions right considerably greater than left. Electronically Signed   By: Inez Catalina M.D.   On: 08/22/2016 14:50   US Venous Img Lower Unilateral Right  Result Date: 08/21/2016 CLINICAL DATA:  Right lower extremity swelling for 3 days EXAM: RIGHT LOWER EXTREMITY VENOUS DOPPLER ULTRASOUND TECHNIQUE: Gray-scale sonography with graded compression, as well as color Doppler and duplex ultrasound were performed to evaluate the lower extremity deep venous systems from the level of the common femoral vein and including the common femoral, femoral, profunda femoral, popliteal and calf veins including the posterior tibial, peroneal and gastrocnemius veins when visible. The superficial great saphenous vein was also interrogated. Spectral Doppler was utilized to evaluate flow at rest and with distal augmentation maneuvers in the common femoral, femoral and popliteal veins. COMPARISON:  None. FINDINGS: Contralateral Common Femoral Vein: Respiratory phasicity is normal and symmetric with the symptomatic side. No evidence of thrombus. Normal compressibility. Common Femoral Vein: No evidence of thrombus. Normal compressibility, respiratory phasicity and response to augmentation. Saphenofemoral Junction: No evidence of thrombus. Normal compressibility and flow on color Doppler imaging. Profunda Femoral Vein: No evidence of thrombus. Normal compressibility and flow on color Doppler imaging. Femoral Vein: No evidence of thrombus. Normal compressibility, respiratory phasicity and response to augmentation. Popliteal Vein: No evidence of thrombus. Normal compressibility, respiratory phasicity and response to augmentation. Calf Veins: No evidence of thrombus. Normal  compressibility and flow on color Doppler imaging. Right peroneal vein is not well seen. Superficial Great Saphenous Vein: No evidence of thrombus. Normal compressibility and flow on color Doppler imaging. Venous Reflux:  None. Other Findings:  None. IMPRESSION: No evidence of DVT within the right lower extremity. Electronically Signed   By: Abelardo Diesel M.D.   On: 08/21/2016 18:36   US Paracentesis  Result Date: 08/22/2016 INDICATION: Cirrhosis, recurrent ascites. Request made for diagnostic and therapeutic paracentesis. EXAM: ULTRASOUND GUIDED DIAGNOSTIC AND THERAPEUTIC PARACENTESIS MEDICATIONS: None. COMPLICATIONS: None immediate. PROCEDURE: Informed written consent was obtained from the patient after a discussion of the risks, benefits and alternatives to treatment. A timeout was performed prior to the initiation of the procedure. Initial ultrasound scanning demonstrates a moderate amount of ascites within the right lower abdominal quadrant. The right lower abdomen was prepped and draped in the usual sterile fashion. 1% lidocaine was used for local anesthesia. Following this a Yueh catheter was introduced. An ultrasound image was saved for documentation purposes. The paracentesis was performed. The catheter was removed and a dressing was applied. The patient tolerated the procedure well without immediate post procedural complication. FINDINGS: A total of approximately 3.6 liters of yellow fluid was removed. Samples were sent to the laboratory as requested by the clinical team. IMPRESSION: Successful ultrasound-guided diagnostic and therapeutic paracentesis yielding 3.6 liters of peritoneal fluid. Read by: Rowe Robert, PA-C Electronically Signed   By: Markus Daft M.D.   On: 08/22/2016 14:46    EKG:   Orders placed or performed in visit on 12/31/13  . EKG 12-Lead  . EKG 12-Lead    ASSESSMENT AND PLAN:   83 year old male with past medical history  significant for liver cirrhosis, congestive heart  failure, CAD status post CABG, arthritis presents to hospital secondary to worsening right lower extremity swelling.  #1 right lower extremity cellulitis-started after trauma, there is an open laceration present. -Keep the leg elevated at least on 2 pillows. -MRSA positive in blood based on BCID, . Continue vancomycin and Unasyn at this time. DC unasyn tomorrow after final culture results available  #2 dyspnea with wheezing- stat chest x-ray. Due to history of CHF and cirrhosis especially -Chest x-ray with large right pleural effusion. Ultrasound-guided thoracentesis requested -Breathing treatments ordered. O2 supplementation if needed  #3 liver cirrhosis with ascites-complaining of nausea and abdominal distention. -Status post paracentesis on 08/22/2016 and almost 4 L taken out -Continue Lasix and Aldactone  #4 thrombocytopenia-chronic secondary to liver disease. No indication for transfusion. No active bleeding. Monitor at this time  #5 DVT prophylaxis-we will do Ted's and SCDs.   Physical therapy consulted. Patient ambulates with a cane at baseline Updated his son at bedside  All the records are reviewed and case discussed with Care Management/Social Workerr. Management plans discussed with the patient, family and they are in agreement.  CODE STATUS: Full code  TOTAL TIME TAKING CARE OF THIS PATIENT: 38 minutes.   POSSIBLE D/C IN 2-3 DAYS, DEPENDING ON CLINICAL CONDITION.   Markavious Micco M.D on 08/23/2016 at 11:07 AM  Between 7am to 6pm - Pager - [phone]  After 6pm go to [email or web address] - password [email or web address] [facility]  Office  [phone]  CC: Primary care physician; [place] Pink, MD

## 2016-08-23 NOTE — Progress Notes (Signed)
Patient is A&O x 4. Extremely HOH. Up with assist x2 and [employer]. In chair for a short period today. Thoracenteses this shift, pulled 1.7L. Dressing is intact. BP improved from procedure. Right leg red with large scabbed over area, +2 edema.. Tolerated diet. Meds in applesauce. Used urinal and incont of urine.

## 2016-08-23 NOTE — Procedures (Signed)
PROCEDURE SUMMARY:  Successful US guided right thoracentesis. Yielded 1.7 L of clear yellow fluid. Pt tolerated procedure well. No immediate complications.  Specimen was not sent for labs. CXR ordered.  Ascencion Dike PA-C 08/23/2016 1:01 PM

## 2016-08-24 LAB — CULTURE, BLOOD (ROUTINE X 2): Special Requests: ADEQUATE

## 2016-08-24 LAB — BASIC METABOLIC PANEL
Anion gap: 8 (ref 5–15)
BUN: 29 mg/dL — AB (ref 6–20)
CALCIUM: 8.1 mg/dL — AB (ref 8.9–10.3)
CO2: 26 mmol/L (ref 22–32)
CREATININE: 0.93 mg/dL (ref 0.61–1.24)
Chloride: 99 mmol/L — ABNORMAL LOW (ref 101–111)
GFR calc Af Amer: >60 mL/min (ref >60)
GLUCOSE: 97 mg/dL (ref 65–99)
POTASSIUM: 3.9 mmol/L (ref 3.5–5.1)
SODIUM: 133 mmol/L — AB (ref 135–145)

## 2016-08-24 MED ORDER — DOXYCYCLINE HYCLATE 100 MG PO TABS: 100.0000 mg | ORAL_TABLET | Freq: Two times a day (BID) | ORAL | 0 refills | Status: AC

## 2016-08-24 MED ORDER — FLUTICASONE-SALMETEROL 250-50 MCG/DOSE IN AEPB: 1.0000 | INHALATION_SPRAY | Freq: Two times a day (BID) | RESPIRATORY_TRACT | 2 refills | Status: AC

## 2016-08-24 MED ORDER — DOXYCYCLINE HYCLATE 100 MG PO TABS
100.0000 mg | ORAL_TABLET | Freq: Two times a day (BID) | ORAL | Status: DC
  Administered 2016-08-24 – 2016-08-25 (×3): 100 mg via ORAL
  Filled 2016-08-24 (×3): qty 1

## 2016-08-24 MED ORDER — FUROSEMIDE 40 MG PO TABS: 40.0000 mg | ORAL_TABLET | Freq: Every day | ORAL | 0 refills | Status: AC

## 2016-08-24 NOTE — Progress Notes (Signed)
[facility] NAME: Sean Jensen    MR#:  [id number]  DATE OF BIRTH:  28-Jan-1922  SUBJECTIVE:  CHIEF COMPLAINT:   Chief Complaint  Patient presents with  . Cellulitis  . Wound Check   -had right thoracentesis yesterday and 1.7L taken out, dyspnea is much better today - PT consult today -Right leg cellulitis is improving  REVIEW OF SYSTEMS:  Review of Systems  Constitutional: Positive for malaise/fatigue. Negative for chills and fever.  HENT: Positive for hearing loss. Negative for congestion and nosebleeds.   Eyes: Negative for blurred vision and double vision.  Respiratory: Positive for cough and shortness of breath. Negative for wheezing.   Cardiovascular: Positive for leg swelling. Negative for chest pain and palpitations.  Gastrointestinal: Negative for abdominal pain, constipation, diarrhea, nausea and vomiting.  Genitourinary: Negative for dysuria.  Musculoskeletal: Positive for back pain and myalgias.  Skin: Positive for rash.  Neurological: Negative for dizziness, sensory change, speech change, focal weakness, seizures and headaches.  Psychiatric/Behavioral: Negative for depression.    DRUG ALLERGIES:   Allergies  Allergen Reactions  . Hydrocodone Other (See Comments)    Pt states this med and "all other narcotics" make him "crazy" and hallucinate.    VITALS:  Blood pressure (!) 113/51, pulse 80, temperature 98.4 F (36.9 C), temperature source Oral, resp. rate 18, height 5\' 6"  (1.676 m), weight 70.4 kg (155 lb 1.6 oz), SpO2 97 %.  PHYSICAL EXAMINATION:  Physical Exam  GENERAL:  83 y.o.-year-old Elderly patient lying in the bed with no acute distress.  EYES: Pupils equal, round, reactive to light and accommodation. No scleral icterus. Extraocular muscles intact.  HEENT: Head atraumatic, normocephalic. Oropharynx and nasopharynx clear.  NECK:  Supple, no jugular venous distention. No thyroid enlargement, no  tenderness.  LUNGS: Moving air bilaterally decreased at the bases especially at right base with some rhonchi and rales. Not using accessory muscles to breathe.  CARDIOVASCULAR: S1, S2 normal. No  rubs, or gallops. 3/6 systolic murmur present ABDOMEN: Soft but distended, nontender. Bowel sounds present. No organomegaly or mass.  EXTREMITIES: No  cyanosis, or clubbing. Left leg has no edema, right lower extremity is erythematous especially on the lateral side, improving swelling noted from the foot to the knee. There is an open laceration with no discharge noted NEUROLOGIC: Cranial nerves II through XII are intact. Muscle strength 5/5 in all extremities. Sensation intact. Gait not checked. Global weakness present PSYCHIATRIC: The patient is alert and oriented x 3.  SKIN: No obvious rash, lesion, or ulcer.    LABORATORY PANEL:   CBC  Recent Labs Lab 08/22/16 0504  WBC 6.7  HGB 10.5*  HCT 30.0*  PLT 61*   ------------------------------------------------------------------------------------------------------------------  Chemistries   Recent Labs Lab 08/21/16 1752  08/24/16 0402  NA 129*  < > 133*  K 4.1  < > 3.9  CL 95*  < > 99*  CO2 24  < > 26  GLUCOSE 113*  < > 97  BUN 34*  < > 29*  CREATININE 1.10  < > 0.93  CALCIUM 8.6*  < > 8.1*  AST 46*  --   --   ALT 27  --   --   ALKPHOS 267*  --   --   BILITOT 2.8*  --   --   < > = values in this interval not displayed. ------------------------------------------------------------------------------------------------------------------  Cardiac Enzymes No results for input(s): TROPONINI in the last 168 hours. ------------------------------------------------------------------------------------------------------------------  RADIOLOGY:  Dg Chest 1 View  Result Date: 08/23/2016 CLINICAL DATA:  Patient status post thoracentesis. EXAM: CHEST 1 VIEW COMPARISON:  Chest radiograph 08/22/2016. FINDINGS: Anterior cervical spinal fusion  hardware. Stable enlarged cardiac and mediastinal contours status post median sternotomy and CABG procedure. Aortic atherosclerosis. Interval decrease in size of moderate to large right pleural effusion. Improved aeration of the right upper lung. Left lung is clear. IMPRESSION: Slight interval decrease in size of persistent large right pleural effusion. Improved aeration of the right upper lung. Electronically Signed   By: Lovey Newcomer M.D.   On: 08/23/2016 13:14   Dg Chest 2 View  Result Date: 08/22/2016 CLINICAL DATA:  Recent paracentesis, shortness of Breath EXAM: CHEST  2 VIEW COMPARISON:  None. FINDINGS: Cardiac shadow is mildly enlarged. Postsurgical changes are seen. Aortic calcifications are noted. Large right-sided pleural effusion is seen with only minimal aerated right upper lobe. The left lung is well aerated with mild atelectasis and small effusion. No acute bony abnormality is seen. IMPRESSION: Bilateral pleural effusions right considerably greater than left. Electronically Signed   By: Inez Catalina M.D.   On: 08/22/2016 14:50   US Paracentesis  Result Date: 08/22/2016 INDICATION: Cirrhosis, recurrent ascites. Request made for diagnostic and therapeutic paracentesis. EXAM: ULTRASOUND GUIDED DIAGNOSTIC AND THERAPEUTIC PARACENTESIS MEDICATIONS: None. COMPLICATIONS: None immediate. PROCEDURE: Informed written consent was obtained from the patient after a discussion of the risks, benefits and alternatives to treatment. A timeout was performed prior to the initiation of the procedure. Initial ultrasound scanning demonstrates a moderate amount of ascites within the right lower abdominal quadrant. The right lower abdomen was prepped and draped in the usual sterile fashion. 1% lidocaine was used for local anesthesia. Following this a Yueh catheter was introduced. An ultrasound image was saved for documentation purposes. The paracentesis was performed. The catheter was removed and a dressing was applied.  The patient tolerated the procedure well without immediate post procedural complication. FINDINGS: A total of approximately 3.6 liters of yellow fluid was removed. Samples were sent to the laboratory as requested by the clinical team. IMPRESSION: Successful ultrasound-guided diagnostic and therapeutic paracentesis yielding 3.6 liters of peritoneal fluid. Read by: Rowe Robert, PA-C Electronically Signed   By: Markus Daft M.D.   On: 08/22/2016 14:46   US Thoracentesis Asp Pleural Space W/img Guide  Result Date: 08/23/2016 INDICATION: History of cirrhosis. Recurrent right pleural effusion, likely hydrothorax. Shortness of breath. Request for therapeutic thoracentesis. EXAM: ULTRASOUND GUIDED RIGHT THORACENTESIS MEDICATIONS: None. COMPLICATIONS: None immediate. Postprocedural chest x-ray negative for pneumothorax. PROCEDURE: An ultrasound guided thoracentesis was thoroughly discussed with the patient and questions answered. The benefits, risks, alternatives and complications were also discussed. The patient understands and wishes to proceed with the procedure. Written consent was obtained. Ultrasound was performed to localize and mark an adequate pocket of fluid in the right chest. The area was then prepped and draped in the normal sterile fashion. 1% Lidocaine was used for local anesthesia. Under ultrasound guidance a Safe-T-Centesis catheter was introduced. Thoracentesis was performed. The catheter was removed and a dressing applied. FINDINGS: A total of approximately 1.7 L of clear yellow fluid was removed. IMPRESSION: Successful ultrasound guided right thoracentesis yielding 1.7 L of pleural fluid. Read by: Ascencion Dike PA-C Electronically Signed   By: Lucrezia Europe M.D.   On: 08/23/2016 13:17    EKG:   Orders placed or performed in visit on 12/31/13  . EKG 12-Lead  . EKG 12-Lead    ASSESSMENT AND PLAN:  83 year old male with past medical history significant for liver cirrhosis, congestive heart  failure, CAD status post CABG, arthritis presents to hospital secondary to worsening right lower extremity swelling.  #1 right lower extremity cellulitis-started after trauma, there is an open laceration present Which is now -Keep the leg elevated at least on 2 pillows. -1 set of blood cultures with coagulase-negative staphylococcus, likely contaminant. Second set is negative .  - Received vancomycin and Unasyn at this time. Change to doxycycline at discharge  #2 dyspnea with wheezing-  secondary to worsening right pleural effusion. -. Due to history of CHF and cirrhosis especially -Chest x-ray with large right pleural effusion. Ultrasound-guided thoracentesis done yesterday and almost 1.7 L taken out with much improvement in his symptoms -Breathing treatments ordered. Not requiring supplemental oxygen  #3 liver cirrhosis with ascites-complaining of nausea and abdominal distention. -Status post paracentesis on 08/22/2016 and almost 4 L taken out -Continue Lasix and Aldactone  #4 thrombocytopenia-chronic secondary to liver disease. No indication for transfusion. No active bleeding. Monitor at this time  #5 DVT prophylaxis-we will do Ted's and SCDs.   Physical therapy consulted. Patient ambulates with a cane at baseline Updated his daughter at bedside Family wants patient to go to rehabilitation.    All the records are reviewed and case discussed with Care Management/Social Workerr. Management plans discussed with the patient, family and they are in agreement.  CODE STATUS: Full code  TOTAL TIME TAKING CARE OF THIS PATIENT: 37 minutes.   POSSIBLE D/C Today, DEPENDING ON CLINICAL CONDITION.   Teejay Meader M.D on 08/24/2016 at 11:01 AM  Between 7am to 6pm - Pager - [phone]  After 6pm go to [email or web address] - password [email or web address] [facility]  Office  [phone]  CC: Primary care physician; [place] Pink, MD

## 2016-08-24 NOTE — Clinical Social Work Note (Signed)
Clinical Social Work Assessment  Patient Details  Name: Sean Jensen. MRN: [id number] Date of Birth: Dec 03, 1921  Date of referral:  08/24/16               Reason for consult:  Facility Placement                Permission sought to share information with:  Chartered certified accountant granted to share information::  Yes, Verbal Permission Granted  Name::        Agency::     Relationship::     Contact Information:     Housing/Transportation Living arrangements for the past 2 months:  Single Family Home Source of Information:  Patient, Adult Children, Medical Team Patient Interpreter Needed:  None Criminal Activity/Legal Involvement Pertinent to Current Situation/Hospitalization:  No - Comment as needed Significant Relationships:  Adult Children, Siblings, Community Support Lives with:  Self Do you feel safe going back to the place where you live?  Yes Need for family participation in patient care:  No (Coment)  Care giving concerns:  PT recommendation for STR   Social Worker assessment / plan:  CSW met with the patient, his sister, his son, and his brother-in-law at bedside to discuss discharge planning. The patient is [facility] but able to communication. The patient gave verbal permission to conduct a referral for STR, and he indicated that [facility] is is preference with [facility] as his second preference. The family members agreed.   The CSW attempted to find the patient's PASRR # in [place] [facility]; however, there is an error. The CSW on Monday will have to retreive the PASRR # from the [place] [facility] help desk during regular business hours. The patient will discharge tomorrow pending medical stability. The patient will most likely transport via [facility] or EMS depending on his needs day of discharge. His daughter Sean Jensen will be the main contact. CSW will provide bed offers when available.  Employment status:  Retired Nurse, adult PT Recommendations:   [place] / Referral to community resources:  [place]  Patient/Family's Response to care:  The patient and family were thankful for assistance.  Patient/Family's Understanding of and Emotional Response to Diagnosis, Current Treatment, and Prognosis:  The patient understands the need for STR and was disappointed but willing to follow the treatment plan.   Emotional Assessment Appearance:  Appears stated age Attitude/Demeanor/Rapport:  Flirtatious (Comment) (Made a comment about having the CSW get into bed with him) Affect (typically observed):  Appropriate, Pleasant Orientation:  Oriented to Self, Oriented to Place, Oriented to  Time, Oriented to Situation Alcohol / Substance use:  Never Used Psych involvement (Current and /or in the community):  No (Comment)  Discharge Needs  Concerns to be addressed:  Care Coordination, Discharge Planning Concerns Readmission within the last 30 days:  No Current discharge risk:  None Barriers to Discharge:  Continued Medical Work up   [employer], LCSW 08/24/2016, 2:31 PM

## 2016-08-24 NOTE — Evaluation (Signed)
Physical Therapy Evaluation Patient Details Name: Sean Jensen. MRN: [id number] DOB: Jan 17, 1922 Today's Date: 08/24/2016   History of Present Illness  Pt is a 83 y.o. male presenting to hospital after hitting R leg on bed (R LE red, hot, swollen, tender, and oozing some serosanguineous fluid).  Pt admitted with R LE cellultis and ascites.  Pt s/p paracentesis 7/27 (3.6 L pulled) and s/p thoracentesis 7/28 (1.7 L pulled).  PMH includes skin CA, CHF, CAD, htn, SOB, CABG x4, R TKR, large umbilical hernia.  Clinical Impression  Prior to hospital admission, pt was modified independent ambulating with SPC.  Pt lives with his son in 1 level home with stairs to enter.  Currently pt is CGA to min assist with transfers and ambulation with RW.  Pt requiring vc's for safe transfer technique and pt also intermittently unsteady ambulating with RW requiring assist for balance/safety.  Pt's R LE pain 8/10 at rest beginning and end of session but was 6/10 with ambulation.  Pt would benefit from skilled PT to address noted impairments and functional limitations (see below for any additional details).  Upon hospital discharge, recommend pt discharge to [facility].    Follow Up Recommendations SNF    Equipment Recommendations  Rolling walker with 5" wheels    Recommendations for Other Services       Precautions / Restrictions Precautions Precautions: Fall Precaution Comments: Elevated R LE on 2 pillows Restrictions Weight Bearing Restrictions: No      Mobility  Bed Mobility               General bed mobility comments: Deferred d/t pt sitting up in recliner beginning and end of session.  Transfers Overall transfer level: Needs assistance Equipment used: Rolling walker (2 wheeled) Transfers: Sit to/from Stand Sit to Stand: Min guard;Min assist         General transfer comment: pt initially unable to stand on own (pt pulling on RW) but with vc's for hand placement pt able to stand with CGA but  required min assist to control descent sitting down in chair  Ambulation/Gait Ambulation/Gait assistance: Min guard;Min assist Ambulation Distance (Feet): 80 Feet Assistive device: Rolling walker (2 wheeled)   Gait velocity: decreased   General Gait Details: mild decreased stance time R LE; increased R lateral sway; vc's to increase UE support through RW to offweight R LE d/t pain; intermittently unsteady (with lateral sway to R and with turns) requiring min assist to steady  Stairs            Wheelchair Mobility    Modified Rankin (Stroke Patients Only)       Balance Overall balance assessment: Needs assistance;History of Falls Sitting-balance support: No upper extremity supported;Feet supported Sitting balance-Leahy Scale: Good Sitting balance - Comments: sitting reaching within BOS   Standing balance support: Single extremity supported Standing balance-Leahy Scale: Poor Standing balance comment: pt able to stand with single UE support on RW (CGA reaching with L UE within BOS but CGA to min assist to steady to reach within BOS with R UE)                             Pertinent Vitals/Pain Pain Assessment: 0-10 Pain Score: 8  (8/10 beginning and end of session; 6/10 with ambulation) Pain Location: R LE Pain Descriptors / Indicators: Sore;Tender Pain Intervention(s): Limited activity within patient's tolerance;Monitored during session;Premedicated before session;Repositioned (R LE elevated on pillow)  Vitals (HR  and O2 on room air) stable and WFL throughout treatment session.    Home Living Family/patient expects to be discharged to:: Private residence Living Arrangements: Children (Pt's son) Available Help at Discharge: Family Type of Home: House Home Access: Stairs to enter Entrance Stairs-Rails: Right;Left;Can reach both Entrance Stairs-Number of Steps: [place]: One level (does not go to basement anymore) Home Equipment: Walker - 4 wheels;Cane -  single point;Grab bars - tub/shower      Prior Function Level of Independence: Independent with assistive device(s)         Comments: Pt ambulating with SPC.  Pt reports 1 fall end of May/beginning of June outside.     Hand Dominance   Dominant Hand: Right    Extremity/Trunk Assessment   Upper Extremity Assessment Upper Extremity Assessment: Generalized weakness    Lower Extremity Assessment Lower Extremity Assessment: Generalized weakness       Communication   Communication: HOH  Cognition Arousal/Alertness: Awake/alert Behavior During Therapy: WFL for tasks assessed/performed Overall Cognitive Status: Within Functional Limits for tasks assessed                                        General Comments General comments (skin integrity, edema, etc.): Pt's daughter present during session.  Nursing cleared pt for participation in physical therapy.  Pt agreeable to PT session.    Exercises     Assessment/Plan    PT Assessment Patient needs continued PT services  PT Problem List Decreased strength;Decreased balance;Decreased mobility;Decreased knowledge of use of DME;Decreased knowledge of precautions;Pain       PT Treatment Interventions DME instruction;Gait training;Stair training;Functional mobility training;Therapeutic activities;Therapeutic exercise;Balance training;Patient/family education    PT Goals (Current goals can be found in the Care Plan section)  Acute Rehab PT Goals Patient Stated Goal: to have less R LE pain PT Goal Formulation: With patient/family Time For Goal Achievement: 09/07/16 Potential to Achieve Goals: Good    Frequency Min 2X/week   Barriers to discharge Decreased caregiver support      Co-evaluation               AM-PAC PT "6 Clicks" Daily Activity  Outcome Measure Difficulty turning over in bed (including adjusting bedclothes, sheets and blankets)?: A Little Difficulty moving from lying on back to sitting  on the side of the bed? : A Little Difficulty sitting down on and standing up from a chair with arms (e.g., wheelchair, bedside commode, etc,.)?: Total Help needed moving to and from a bed to chair (including a wheelchair)?: A Little Help needed walking in hospital room?: A Little Help needed climbing 3-5 steps with a railing? : A Lot 6 Click Score: 15    End of Session Equipment Utilized During Treatment: Gait belt Activity Tolerance: Patient tolerated treatment well Patient left: in chair;with call bell/phone within reach;with chair alarm set;with family/visitor present (R LE elevated on pillow) Nurse Communication: Mobility status;Precautions (Pt's pain status) PT Visit Diagnosis: Other abnormalities of gait and mobility (R26.89);Muscle weakness (generalized) (M62.81);History of falling (Z91.81);Pain Pain - Right/Left: Right Pain - part of body: Leg    Time: 1046-1105 PT Time Calculation (min) (ACUTE ONLY): 19 min   Charges:   PT Evaluation $PT Eval Low Complexity: 1 Procedure     PT G Codes:   PT G-Codes **NOT FOR INPATIENT CLASS** Functional Assessment Tool Used: AM-PAC 6 Clicks Basic Mobility Functional Limitation: Mobility: Walking  and moving around Mobility: Walking and Moving Around Current Status [phone]): At least 40 percent but less than 60 percent impaired, limited or restricted Mobility: Walking and Moving Around Goal Status [phone]): At least 1 percent but less than 20 percent impaired, limited or restricted    Leitha Bleak, PT 08/24/16, 11:29 AM [phone]

## 2016-08-24 NOTE — Progress Notes (Signed)
Patient is A&O x 4, forgetful at times. Up to chair most of the day. Noticable ABD hernia and acites Used urinal at bedside all day. Good appetite. Honey-thickened liquids. Chair alarm on for safety. HOH, bilat hearing aids. Uses call light approp.  Up with assist x1 and WW, gait unsteady. Scat bruising throughout legs and arms. Right lower leg redness, skin tear scabbed over. Edema reduced to RLE, +1.

## 2016-08-24 NOTE — NC FL2 (Signed)
[place] LEVEL OF CARE SCREENING TOOL     IDENTIFICATION  Patient Name: Sean Jensen. Birthdate: [birth year] Sex: male Admission Date (Current Location): 08/21/2016  [place] and [place] Number:  Engineering geologist and Address:  [facility], [address] [place], [place] [address]      Provider Number: [id number]  Attending Physician Name and Address:  Gladstone Lighter, MD  Relative Name and Phone Number:       Current Level of Care: Hospital Recommended Level of Care: [place] Prior Approval Number:    Date Approved/Denied:   PASRR Number:    Discharge Plan: SNF    Current Diagnoses: Patient Active Problem List   Diagnosis Date Noted  . Cellulitis 08/21/2016  . Mixed hyperlipidemia 12/18/2015  . Essential hypertension 12/18/2015  . CAD (coronary artery disease) 12/18/2015  . Arthritis 12/18/2015  . Acute on chronic diastolic CHF (congestive heart failure), NYHA class 3 ([place]) 12/12/2015  . Cirrhosis of liver with ascites ([place]) 03/26/2014  . Shortness of breath 08/24/2013    Orientation RESPIRATION BLADDER Height & Weight     Self, Time, Situation, Place  Normal Continent Weight: 155 lb 1.6 oz (70.4 kg) Height:  5\' 6"  (167.6 cm)  BEHAVIORAL SYMPTOMS/MOOD NEUROLOGICAL BOWEL NUTRITION STATUS      Continent Diet (Regular with honey thick liquids)  AMBULATORY STATUS COMMUNICATION OF NEEDS Skin   Extensive Assist Verbally Normal                       Personal Care Assistance Level of Assistance  Bathing, Feeding, Dressing Bathing Assistance: Limited assistance Feeding assistance: Independent Dressing Assistance: Limited assistance     Functional Limitations Info  Hearing   Hearing Info: Impaired      SPECIAL CARE FACTORS FREQUENCY  PT (By licensed PT)     PT Frequency: Up to 5X per day, 5 days per week              Contractures Contractures Info: Present    Additional Factors  Info  Code Status, Allergies Code Status Info: Full Allergies Info: Hydrocodone           Current Medications (08/24/2016):  This is the current hospital active medication list Current Facility-Administered Medications  Medication Dose Route Frequency Provider Last Rate Last Dose  . 0.9 %  sodium chloride infusion  250 mL Intravenous PRN Baxter Hire, MD      . acetaminophen (TYLENOL) tablet 650 mg  650 mg Oral Q6H PRN Baxter Hire, MD   325 mg at 08/24/16 8756   Or  . acetaminophen (TYLENOL) suppository 650 mg  650 mg Rectal Q6H PRN Baxter Hire, MD      . doxazosin (CARDURA) tablet 4 mg  4 mg Oral Daily Baxter Hire, MD   4 mg at 08/24/16 4332  . doxycycline (VIBRA-TABS) tablet 100 mg  100 mg Oral Q12H Gladstone Lighter, MD   100 mg at 08/24/16 1212  . fluticasone (FLONASE) 50 MCG/ACT nasal spray 1 spray  1 spray Each Nare Daily Baxter Hire, MD   1 spray at 08/24/16 [phone]  . furosemide (LASIX) tablet 40 mg  40 mg Oral Daily Baxter Hire, MD   40 mg at 08/24/16 0841  . gabapentin (NEURONTIN) capsule 300 mg  300 mg Oral Daily Baxter Hire, MD   300 mg at 08/24/16 0841  . ipratropium-albuterol (DUONEB) 0.5-2.5 (3) MG/3ML nebulizer solution  3 mL  3 mL Nebulization Q6H PRN Gladstone Lighter, MD      . loratadine (CLARITIN) tablet 10 mg  10 mg Oral Daily Baxter Hire, MD   10 mg at 08/24/16 0841  . multivitamin with minerals tablet 1 tablet  1 tablet Oral Daily Baxter Hire, MD   1 tablet at 08/24/16 [phone]  . rosuvastatin (CRESTOR) tablet 5 mg  5 mg Oral QHS Baxter Hire, MD   5 mg at 08/23/16 2210  . sodium chloride flush (NS) 0.9 % injection 3 mL  3 mL Intravenous Q12H Baxter Hire, MD   3 mL at 08/24/16 1000  . sodium chloride flush (NS) 0.9 % injection 3 mL  3 mL Intravenous PRN Baxter Hire, MD      . spironolactone (ALDACTONE) tablet 50 mg  50 mg Oral Daily Baxter Hire, MD   50 mg at 08/24/16 0841  . vitamin B-12 (CYANOCOBALAMIN)  tablet 1,000 mcg  1,000 mcg Oral Daily Baxter Hire, MD   1,000 mcg at 08/24/16 [phone]  . vitamin C (ASCORBIC ACID) tablet 1,000 mg  1,000 mg Oral Daily Baxter Hire, MD   1,000 mg at 08/24/16 0930     Discharge Medications: Please see discharge summary for a list of discharge medications.  Relevant Imaging Results:  Relevant Lab Results:   Additional Information SS# [id number]  Zettie Pho, LCSW

## 2016-08-25 MED ORDER — IPRATROPIUM-ALBUTEROL 0.5-2.5 (3) MG/3ML IN SOLN: 3.0000 mL | Freq: Four times a day (QID) | RESPIRATORY_TRACT | 0 refills | Status: AC | PRN

## 2016-08-25 NOTE — Discharge Summary (Signed)
[place] at [place] NAME: Sean Jensen    MR#:  [id number]  DATE OF BIRTH:  1921-06-28  DATE OF ADMISSION:  08/21/2016   ADMITTING PHYSICIAN: Baxter Hire, MD  DATE OF DISCHARGE: 08/25/2016  PRIMARY CARE PHYSICIAN: Maryland Pink, MD   ADMISSION DIAGNOSIS:   Cellulitis of right lower extremity [K48.185]  DISCHARGE DIAGNOSIS:   Active Problems:   Cellulitis   SECONDARY DIAGNOSIS:   Past Medical History:  Diagnosis Date  . Cancer ([place])    skin cancer  . CHF (congestive heart failure) ([place])   . Cirrhosis [facility])     HOSPITAL COURSE:   83 year old male with past medical history significant for liver cirrhosis, congestive heart failure, CAD status post CABG, arthritis presents to hospital secondary to worsening right lower extremity swelling.  #1 right lower extremity cellulitis-started after trauma, with an open laceration-Keep the leg elevated at least on 2 pillows. -1 set of blood cultures with coagulase-negative staphylococcus, likely contaminant. Second set is negative .  - Received vancomycin and Unasyn in the hospital . Changed to doxycycline at discharge  #2 dyspnea with wheezing-  secondary to worsening right pleural effusion. -. Due to history of CHF and cirrhosis especially -Chest x-ray with large right pleural effusion. Ultrasound-guided thoracentesis done on 08/24/2016 and almost 1.7 L taken out with much improvement in his symptoms -Breathing treatments ordered. Not requiring supplemental oxygen -Continue duo nebs as needed for wheezing  #3 liver cirrhosis with ascites-complaining of nausea and abdominal distention. -Status post paracentesis on 08/22/2016 and almost 4 L taken out -Continue Lasix and Aldactone  #4 thrombocytopenia-chronic secondary to liver disease. No indication for transfusion. No active bleeding. Monitor at this time    Physical therapy consulted. Patient ambulates with a cane at  baseline Needs to go to rehabilitation.  DISCHARGE CONDITIONS:   Guarded CONSULTS OBTAINED:    none  DRUG ALLERGIES:   Allergies  Allergen Reactions  . Hydrocodone Other (See Comments)    Pt states this med and "all other narcotics" make him "crazy" and hallucinate.   DISCHARGE MEDICATIONS:   Allergies as of 08/25/2016      Reactions   Hydrocodone Other (See Comments)   Pt states this med and "all other narcotics" make him "crazy" and hallucinate.      Medication List    STOP taking these medications   aspirin EC 81 MG tablet     TAKE these medications   acetaminophen 650 MG CR tablet Commonly known as:  TYLENOL Take 650 mg by mouth every 8 (eight) hours as needed for pain.   doxazosin 4 MG tablet Commonly known as:  CARDURA Take 4 mg by mouth daily.   doxycycline 100 MG tablet Commonly known as:  VIBRA-TABS Take 1 tablet (100 mg total) by mouth every 12 (twelve) hours. X 7 days   fexofenadine 180 MG tablet Commonly known as:  ALLEGRA Take 180 mg by mouth daily.   fluticasone 50 MCG/ACT nasal spray Commonly known as:  FLONASE USE ONE SPRAY IN EACH NOSTRIL EVERY DAY   Fluticasone-Salmeterol 250-50 MCG/DOSE Aepb Commonly known as:  ADVAIR DISKUS Inhale 1 puff into the lungs 2 (two) times daily.   furosemide 40 MG tablet Commonly known as:  LASIX Take 1 tablet (40 mg total) by mouth daily. What changed:  medication strength  how much to take  how to take this  when to take this  additional instructions  Another medication with the same  name was removed. Continue taking this medication, and follow the directions you see here.   gabapentin 300 MG capsule Commonly known as:  NEURONTIN Take 300 mg by mouth daily.   ipratropium-albuterol 0.5-2.5 (3) MG/3ML Soln Commonly known as:  DUONEB Take 3 mLs by nebulization every 6 (six) hours as needed.   MULTI-VITAMINS Tabs Take 1 tablet by mouth daily.   RA VITAMIN B-12 TR 1000 MCG Tbcr Generic  drug:  Cyanocobalamin Take 1,000 mcg by mouth daily.   rosuvastatin 5 MG tablet Commonly known as:  CRESTOR Take 5 mg by mouth at bedtime.   spironolactone 50 MG tablet Commonly known as:  ALDACTONE Take 1 tablet (50 mg total) by mouth daily.   vitamin C 1000 MG tablet Take 1,000 mg by mouth daily.        DISCHARGE INSTRUCTIONS:   1. PCP follow-up in 1-2 weeks  DIET:   Cardiac diet- honey thick liquids Fluid restriction to <1400 cc/day  ACTIVITY:   Activity as tolerated  OXYGEN:   Home Oxygen: No.  Oxygen Delivery: room air  DISCHARGE LOCATION:   nursing home   If you experience worsening of your admission symptoms, develop shortness of breath, life threatening emergency, suicidal or homicidal thoughts you must seek medical attention immediately by calling 911 or calling your MD immediately  if symptoms less severe.  You Must read complete instructions/literature along with all the possible adverse reactions/side effects for all the Medicines you take and that have been prescribed to you. Take any new Medicines after you have completely understood and accpet all the possible adverse reactions/side effects.   Please note  You were cared for by a hospitalist during your hospital stay. If you have any questions about your discharge medications or the care you received while you were in the hospital after you are discharged, you can call the unit and asked to speak with the hospitalist on call if the hospitalist that took care of you is not available. Once you are discharged, your primary care physician will handle any further medical issues. Please note that NO REFILLS for any discharge medications will be authorized once you are discharged, as it is imperative that you return to your primary care physician (or establish a relationship with a primary care physician if you do not have one) for your aftercare needs so that they can reassess your need for medications and  monitor your lab values.    On the day of Discharge:  VITAL SIGNS:   Blood pressure (!) 112/53, pulse 83, temperature 98 F (36.7 C), temperature source Oral, resp. rate 20, height 5\' 6"  (1.676 m), weight 70.4 kg (155 lb 1.6 oz), SpO2 95 %.  PHYSICAL EXAMINATION:    GENERAL:  83 y.o.-year-old Elderly patient lying in the bed with no acute distress.  EYES: Pupils equal, round, reactive to light and accommodation. No scleral icterus. Extraocular muscles intact.  HEENT: Head atraumatic, normocephalic. Oropharynx and nasopharynx clear.  NECK:  Supple, no jugular venous distention. No thyroid enlargement, no tenderness.  LUNGS: Moving air bilaterally decreased at the bases especially at right base with some rhonchi and rales. Not using accessory muscles to breathe.  CARDIOVASCULAR: S1, S2 normal. No  rubs, or gallops. 3/6 systolic murmur present ABDOMEN: Soft but distended, nontender. Bowel sounds present. No organomegaly or mass.  EXTREMITIES: No  cyanosis, or clubbing. Left leg has no edema, right lower extremity is erythematous especially on the lateral side, improving swelling noted from the foot to the  knee. There is an open laceration with no discharge noted NEUROLOGIC: Cranial nerves II through XII are intact. Muscle strength 5/5 in all extremities. Sensation intact. Gait not checked. Global weakness present PSYCHIATRIC: The patient is alert and oriented x 3.  SKIN: No obvious rash, lesion, or ulcer.   DATA REVIEW:   CBC  Recent Labs Lab 08/22/16 0504  WBC 6.7  HGB 10.5*  HCT 30.0*  PLT 61*    Chemistries   Recent Labs Lab 08/21/16 1752  08/24/16 0402  NA 129*  < > 133*  K 4.1  < > 3.9  CL 95*  < > 99*  CO2 24  < > 26  GLUCOSE 113*  < > 97  BUN 34*  < > 29*  CREATININE 1.10  < > 0.93  CALCIUM 8.6*  < > 8.1*  AST 46*  --   --   ALT 27  --   --   ALKPHOS 267*  --   --   BILITOT 2.8*  --   --   < > = values in this interval not displayed.   Microbiology  Results  Results for orders placed or performed during the hospital encounter of 08/21/16  Culture, blood (routine x 2)     Status: Abnormal   Collection Time: 08/21/16  6:36 PM  Result Value Ref Range Status   Specimen Description BLOOD BLOOD RIGHT FOREARM  Final   Special Requests   Final    BOTTLES DRAWN AEROBIC AND ANAEROBIC Blood Culture adequate volume   Culture  Setup Time   Final    GRAM POSITIVE COCCI AEROBIC BOTTLE ONLY CRITICAL RESULT CALLED TO, READ BACK BY AND VERIFIED WITH: JASON ROBBINS AT 5732 ON 08/22/2016 JJB GRAM STAIN REVIEWED-AGREE WITH RESULT    Culture (A)  Final    STAPHYLOCOCCUS SPECIES (COAGULASE NEGATIVE) THE SIGNIFICANCE OF ISOLATING THIS ORGANISM FROM A SINGLE SET OF BLOOD CULTURES WHEN MULTIPLE SETS ARE DRAWN IS UNCERTAIN. PLEASE NOTIFY THE MICROBIOLOGY DEPARTMENT WITHIN ONE WEEK IF SPECIATION AND SENSITIVITIES ARE REQUIRED. Performed at [facility], [place] [address], [place], [place] [address]    Report Status 08/24/2016 FINAL  Final  Blood Culture ID Panel (Reflexed)     Status: Abnormal   Collection Time: 08/21/16  6:36 PM  Result Value Ref Range Status   Enterococcus species NOT DETECTED NOT DETECTED Final   Listeria monocytogenes NOT DETECTED NOT DETECTED Final   Staphylococcus species DETECTED (A) NOT DETECTED Final    Comment: Methicillin (oxacillin) resistant coagulase negative staphylococcus. Possible blood culture contaminant (unless isolated from more than one blood culture draw or clinical case suggests pathogenicity). No antibiotic treatment is indicated for blood  culture contaminants. CRITICAL RESULT CALLED TO, READ BACK BY AND VERIFIED WITH: JASON ROBBINS AT 2706 ON 08/22/2016 JJB    Staphylococcus aureus NOT DETECTED NOT DETECTED Final   Methicillin resistance DETECTED (A) NOT DETECTED Final    Comment: CRITICAL RESULT CALLED TO, READ BACK BY AND VERIFIED WITH: JASON ROBBINS AT 2376 ON 08/22/2016 JJB    Streptococcus species NOT  DETECTED NOT DETECTED Final   Streptococcus agalactiae NOT DETECTED NOT DETECTED Final   Streptococcus pneumoniae NOT DETECTED NOT DETECTED Final   Streptococcus pyogenes NOT DETECTED NOT DETECTED Final   Acinetobacter baumannii NOT DETECTED NOT DETECTED Final   Enterobacteriaceae species NOT DETECTED NOT DETECTED Final   Enterobacter cloacae complex NOT DETECTED NOT DETECTED Final   Escherichia coli NOT DETECTED NOT DETECTED Final   Klebsiella oxytoca  NOT DETECTED NOT DETECTED Final   Klebsiella pneumoniae NOT DETECTED NOT DETECTED Final   Proteus species NOT DETECTED NOT DETECTED Final   Serratia marcescens NOT DETECTED NOT DETECTED Final   Haemophilus influenzae NOT DETECTED NOT DETECTED Final   Neisseria meningitidis NOT DETECTED NOT DETECTED Final   Pseudomonas aeruginosa NOT DETECTED NOT DETECTED Final   Candida albicans NOT DETECTED NOT DETECTED Final   Candida glabrata NOT DETECTED NOT DETECTED Final   Candida krusei NOT DETECTED NOT DETECTED Final   Candida parapsilosis NOT DETECTED NOT DETECTED Final   Candida tropicalis NOT DETECTED NOT DETECTED Final  Culture, blood (routine x 2)     Status: None (Preliminary result)   Collection Time: 08/21/16  6:41 PM  Result Value Ref Range Status   Specimen Description BLOOD LEFT ANTECUBITAL  Final   Special Requests   Final    BOTTLES DRAWN AEROBIC AND ANAEROBIC Blood Culture adequate volume   Culture NO GROWTH 3 DAYS  Final   Report Status PENDING  Incomplete  Body fluid culture     Status: None (Preliminary result)   Collection Time: 08/22/16  2:33 PM  Result Value Ref Range Status   Specimen Description PERITONEAL  Final   Special Requests NONE  Final   Gram Stain   Final    FEW WBC PRESENT, PREDOMINANTLY MONONUCLEAR NO ORGANISMS SEEN    Culture   Final    NO GROWTH 2 DAYS Performed at [facility], [address] [place], [place] [address]    Report Status PENDING  Incomplete    RADIOLOGY:  No results  found.   Management plans discussed with the patient, family and they are in agreement.  CODE STATUS:     Code Status Orders        Start     Ordered   08/21/16 2245  Full code  Continuous     08/21/16 2244    Code Status History    Date Active Date Inactive Code Status Order ID Comments User Context   This patient has a current code status but no historical code status.    Advance Directive Documentation     Most Recent Value  Type of Advance Directive  Healthcare Power of Attorney, Living will, Out of facility DNR (pink MOST or yellow form)  Pre-existing out of facility DNR order (yellow form or pink MOST form)  -  "MOST" Form in Place?  -      TOTAL TIME TAKING CARE OF THIS PATIENT: 37 minutes.    Rick Warnick M.D on 08/25/2016 at 8:14 AM  Between 7am to 6pm - Pager - [phone]  After 6pm go to [email or web address] - Proofreader  [facility]  Office  [phone]  CC: Primary care physician; Maryland Pink, MD   Note: This dictation was prepared with Dragon dictation along with smaller phrase technology. Any transcriptional errors that result from this process are unintentional.

## 2016-08-25 NOTE — Progress Notes (Signed)
Clinical Education officer, museum (CSW) presented bed offers to patient and his daughter Sean Jensen and sister Sean Jensen. They chose [facility]. Patient is medically stable for D/C to [facility] today. Per [facility] admissions coordinator at [facility] patient can come today to room 402. RN will call report to 400 hall at [facility] and arrange EMS for transport. CSW sent D/C orders to [facility] via [facility]. Patient is aware of above. Patient's daughter Sean Jensen and sister Sean Jensen are at bedside and aware of above. Please reconsult if future social work needs arise. CSW signing off.   [employer], LCSW [phone]

## 2016-08-25 NOTE — Progress Notes (Signed)
Speech Language Pathology Treatment: Dysphagia  Patient Details Name: Sean Jensen. MRN: [id number] DOB: 03-21-1921 Today's Date: 08/25/2016 Time: 2841-3244 SLP Time Calculation (min) (ACUTE ONLY): 65 min  Assessment / Plan / Recommendation Clinical Impression  Pt seen today for po trials in order to upgrade diet consistency if appropriate. Pt has been tolerating current dysphagia diet per staff/family/pt. Pt out of bed sitting in chair. He appears, and reports, easy, calm breathing post thoracentesis and paracentesis to remove fluid.  Pt consumed po trials of thin liquids VIA CUP ONLY following aspiration precautions of single, small sips, slowly. NO overt s/s of aspiration were noted during/post trails; vocal quality remained clear upon phonation post trials. No oral phase deficits noted; breathing effort/rate appeared to remain at baseline. Pt consumed trials of soft foods w/ no difficulty w/ bolus management and clearing. Pt fed self w/ setup support d/t overall weakness. Thorough discussion was had w/ pt and family members present re: aspiration precautions followed; food and liquid consistencies; food and drink preps and options to aid swallowing and safety w/ swallowing; eating/drinking behaviors that can be modified to aid swallowing safety. Education given on dysphagia in general and aspiration risk and s/s of aspiration including pulmonary issues that can arise. Education given on the importance of Oral Care b/f oral intake. Answered pt and family questions; Handouts on precautions and recommendations given to pt/family. Suspect pt is close to, or at, his baseline as his Daughter and Sister described inconsistent s/s of dysphagia at home over several months prior to this admission; pt is of advanced age now.  Recommend monitoring pt's toleration of diet at discharge to Rehab w/ the precautions and education in place and f/u w/ skilled ST services if indicated. Family/pt agreed. MD/NSG  updated and agreed w/ above.     HPI HPI: Pt is a 83 y.o. male who reportedly hit the side of his right leg on bed about 3 days ago. The area is now red swollen hot tender and is oozing a little bit of serosanguineous fluid. He reports is very painful there. Pt has a h/o Cancer, cirrhosis, CHF, HOH. Pt had a paracentesis yesterday and almost 4L taken out, dyspnea persists though sats stable on room air then had a US guided right thoracentesis which yielded 1.7 liters of clear, yellow fluid. Pt is currently resting in his chair out of bed; breathing comfortably on room air it appeared. Pt's Daughter stated pt has coughed some earlier this morning and expectorated stringy phlegm(moderate amount). Pt is verbally conversive; HOH w/ aids in place.      SLP Plan  Continue with current plan of care       Recommendations  Diet recommendations: Dysphagia 3 (mechanical soft);Thin liquid Liquids provided via: Cup;No straw Medication Administration: Whole meds with puree Supervision: Patient able to self feed;Intermittent supervision to cue for compensatory strategies Compensations: Minimize environmental distractions;Slow rate;Small sips/bites;Follow solids with liquid;Multiple dry swallows after each bite/sip Postural Changes and/or Swallow Maneuvers: Seated upright 90 degrees;Upright 30-60 min after meal                General recommendations:  (dietician f/u for any supplements) Oral Care Recommendations: Oral care BID;Patient independent with oral care;Staff/trained caregiver to provide oral care Follow up Recommendations: None SLP Visit Diagnosis: Dysphagia, oropharyngeal phase (R13.12) Plan: Continue with current plan of care       [place], [place], [place]  Sean Jensen 08/25/2016, 1:29 PM

## 2016-08-25 NOTE — Care Management Important Message (Signed)
Important Message  Patient Details  Name: Sean Jensen. MRN: [id number] Date of Birth: [birth year]   Medicare Important Message Given:  Yes    Jolly Mango, RN 08/25/2016, 9:27 AM

## 2016-08-25 NOTE — Progress Notes (Signed)
Called report to Levada Dy, Therapist, sports at [facility]. Answered all questions. Called EMS for transport.

## 2016-08-25 NOTE — Clinical Social Work Placement (Signed)
   CLINICAL SOCIAL WORK PLACEMENT  NOTE  Date:  08/25/2016  Patient Details  Name: Sean Jensen. MRN: [id number] Date of Birth: 07/06/1921  Clinical Social Work is seeking post-discharge placement for this patient at the [place] level of care (*CSW will initial, date and re-position this form in  chart as items are completed):  Yes   Patient/family provided with [place] Work Department's list of facilities offering this level of care within the geographic area requested by the patient (or if unable, by the patient's family).  Yes   Patient/family informed of their freedom to choose among providers that offer the needed level of care, that participate in Medicare, Medicaid or managed care program needed by the patient, have an available bed and are willing to accept the patient.  Yes   Patient/family informed of [facility]'s ownership interest in [facility] and [facility], as well as of the fact that they are under no obligation to receive care at these facilities.  PASRR submitted to EDS on       PASRR number received on       Existing PASRR number confirmed on 08/25/16     FL2 transmitted to all facilities in geographic area requested by pt/family on 08/24/16     FL2 transmitted to all facilities within larger geographic area on       Patient informed that his/her managed care company has contracts with or will negotiate with certain facilities, including the following:        Yes   Patient/family informed of bed offers received.  Patient chooses bed at  [facility] )     Physician recommends and patient chooses bed at      Patient to be transferred to  [facility] ) on 08/25/16.  Patient to be transferred to facility by  [facility] )     Patient family notified on 08/25/16 of transfer.  Name of family member notified:   (Patient's sister [name] and daughter [name] are at bedside and aware of D/C today. )      PHYSICIAN       Additional Comment:    _______________________________________________ [name], [name], LCSW 08/25/2016, 12:18 PM

## 2016-08-26 LAB — BODY FLUID CULTURE: Culture: NO GROWTH

## 2016-08-26 LAB — CULTURE, BLOOD (ROUTINE X 2)
CULTURE: NO GROWTH
Special Requests: ADEQUATE

## 2016-08-27 LAB — LIPASE, FLUID: Lipase-Fluid: 23 U/L

## 2016-09-08 ENCOUNTER — Telehealth: Payer: Self-pay | Admitting: Gastroenterology

## 2016-09-08 NOTE — Telephone Encounter (Signed)
Please call dominque, marlin, at [phone]. Mr. Fairhurst is home now and had about 3 1/2 liters of fluid removed from his abdomin.

## 2016-09-08 NOTE — Telephone Encounter (Signed)
Spoke with pt's son, Sherren Mocha. He wanted to let me know about pt's recent ER visit and stay. He has an office visit with his PCP next week and will discuss other issues at that time.

## 2016-09-09 ENCOUNTER — Telehealth: Payer: Self-pay

## 2016-09-09 NOTE — Telephone Encounter (Signed)
Pt was admitted to [facility] due to fall recently. Pt's son stated he is not sure where it went wrong but his Lasix was dropped back down to 40mg  daily.   Last office visit with you he was advised to take:  Lasix 60mg  and Aldactone 50mg  every morning and then Lasix 40mg  in the afternoon at 4:00pm.  He was doing well on this dose and fluid was controlled.   Sherren Mocha stated his belly is looking like its getting bigger. He would like to know if they should increase the Lasix back up? Labs?

## 2016-09-09 NOTE — Telephone Encounter (Signed)
Yes, pt is back home. Please advise on what we need to increase Lasix to?

## 2016-09-09 NOTE — Telephone Encounter (Signed)
Is he back home? If so we can go back up on the medication with checking the bmp weekly during the change.

## 2016-09-09 NOTE — Telephone Encounter (Signed)
Put him back on the 60 mg of Lasix and then check his labs in 1 week.

## 2016-09-10 NOTE — Telephone Encounter (Signed)
Left vm for Sean Jensen, pt's son to return my call regarding increasing Lasix to 60mg 

## 2016-09-11 ENCOUNTER — Other Ambulatory Visit: Payer: Self-pay

## 2016-09-11 NOTE — Telephone Encounter (Signed)
Contacted pt's son Sherren Mocha today to make sure he received my vm regarding increasing his lasix to 60mg . He confirmed this message and will increase. He also stated his father has an appt with his PCP next week and will request a CMP to check kidney function.

## 2016-09-12 ENCOUNTER — Other Ambulatory Visit: Payer: Self-pay

## 2016-09-12 DIAGNOSIS — K7460 Unspecified cirrhosis of liver: Secondary | ICD-10-CM

## 2016-09-12 DIAGNOSIS — R188 Other ascites: Principal | ICD-10-CM

## 2016-09-15 ENCOUNTER — Other Ambulatory Visit
Admission: RE | Admit: 2016-09-15 | Discharge: 2016-09-15 | Disposition: A | Discharge status: Home / Self Care | Payer: Medicare Other | Source: Ambulatory Visit | Attending: Gastroenterology | Admitting: Gastroenterology

## 2016-09-15 ENCOUNTER — Telehealth: Payer: Self-pay | Admitting: Gastroenterology

## 2016-09-15 DIAGNOSIS — R188 Other ascites: Secondary | ICD-10-CM

## 2016-09-15 DIAGNOSIS — K7460 Unspecified cirrhosis of liver: Secondary | ICD-10-CM | POA: Insufficient documentation

## 2016-09-15 LAB — BASIC METABOLIC PANEL
ANION GAP: 9 (ref 5–15)
BUN: 28 mg/dL — ABNORMAL HIGH (ref 6–20)
CALCIUM: 8.5 mg/dL — AB (ref 8.9–10.3)
CO2: 24 mmol/L (ref 22–32)
Chloride: 97 mmol/L — ABNORMAL LOW (ref 101–111)
Creatinine, Ser: 1.04 mg/dL (ref 0.61–1.24)
Glucose, Bld: 112 mg/dL — ABNORMAL HIGH (ref 65–99)
POTASSIUM: 4.7 mmol/L (ref 3.5–5.1)
SODIUM: 130 mmol/L — AB (ref 135–145)

## 2016-09-15 NOTE — Telephone Encounter (Signed)
Patients son, Sherren Mocha, called to let you know that he is having blood work today and he has gained about 8 - 10 pounds in the last week. Please call Todd at [phone]

## 2016-09-16 NOTE — Telephone Encounter (Signed)
OK 

## 2016-09-16 NOTE — Telephone Encounter (Signed)
Pt's family would like him to be set up for a paracentesis because they feel [place] really messed up his lasix and he is now gaining fluid. Pt's sister stated the fluid was under control until he went there. Please advise.

## 2016-09-18 ENCOUNTER — Other Ambulatory Visit: Payer: Self-pay

## 2016-09-18 ENCOUNTER — Telehealth: Payer: Self-pay | Admitting: Gastroenterology

## 2016-09-18 DIAGNOSIS — R188 Other ascites: Secondary | ICD-10-CM

## 2016-09-18 NOTE — Telephone Encounter (Signed)
Sean Jensen called from [facility] and wants to know if Sean Jensen has an appt to have fluid removed. Please call her at [phone]

## 2016-09-18 NOTE — Telephone Encounter (Signed)
Called Angel with [facility] regarding appt for paracentesis for pt. Advised her I am waiting on a call from [facility] with a date.

## 2016-09-19 ENCOUNTER — Telehealth: Payer: Self-pay

## 2016-09-19 NOTE — Discharge Instructions (Signed)
Paracentesis, Care After °Refer to this sheet in the next few weeks. These instructions provide you with information about caring for yourself after your procedure. Your health care provider may also give you more specific instructions. Your treatment has been planned according to current medical practices, but problems sometimes occur. Call your health care provider if you have any problems or questions after your procedure. °What can I expect after the procedure? °After your procedure, it is common to have a small amount of clear fluid coming from the puncture site. °Follow these instructions at home: °· Return to your normal activities as told by your health care provider. Ask your health care provider what activities are safe for you. °· Take over-the-counter and prescription medicines only as told by your health care provider. °· Do not take baths, swim, or use a hot tub until your health care provider approves. °· Follow instructions from your health care provider about: °? How to take care of your puncture site. °? When and how you should change your bandage (dressing). °? When you should remove your dressing. °· Check your puncture area every day signs of infection. Watch for: °? Redness, swelling, or pain. °? Fluid, blood, or pus. °· Keep all follow-up visits as told by your health care provider. This is important. °Contact a health care provider if: °· You have redness, swelling, or pain at your puncture site. °· You start to have more clear fluid coming from your puncture site. °· You have blood or pus coming from your puncture site. °· You have chills. °· You have a fever. °Get help right away if: °· You develop chest pain or shortness of breath. °· You develop increasing pain, discomfort, or swelling in your abdomen. °· You feel dizzy or light-headed or you pass out. °This information is not intended to replace advice given to you by your health care provider. Make sure you discuss any questions you  have with your health care provider. °Document Released: 05/30/2014 Document Revised: 06/21/2015 Document Reviewed: 03/28/2014 °Elsevier Interactive Patient Education © 2018 [employer] ° °

## 2016-09-19 NOTE — Telephone Encounter (Signed)
Pt's son, Sherren Mocha notified of lab results.

## 2016-09-19 NOTE — Telephone Encounter (Signed)
-----   Message from Lucilla Lame, MD sent at 09/15/2016  5:03 PM EDT ----- The patient's creatinine is doing well.  Let the patient know.

## 2016-09-22 ENCOUNTER — Ambulatory Visit
Admission: RE | Admit: 2016-09-22 | Discharge: 2016-09-22 | Disposition: A | Discharge status: Home / Self Care | Payer: Medicare Other | Source: Ambulatory Visit | Attending: Gastroenterology | Admitting: Gastroenterology

## 2016-09-22 DIAGNOSIS — R188 Other ascites: Secondary | ICD-10-CM | POA: Insufficient documentation

## 2016-09-22 MED ORDER — ALBUMIN HUMAN 25 % IV SOLN
25.0000 g | Freq: Once | INTRAVENOUS | Status: AC
  Administered 2016-09-22: 25 g via INTRAVENOUS

## 2016-09-22 MED ORDER — ALBUMIN HUMAN 25 % IV SOLN
INTRAVENOUS | Status: AC
  Administered 2016-09-22: 25 g via INTRAVENOUS
  Filled 2016-09-22: qty 100

## 2016-09-26 ENCOUNTER — Other Ambulatory Visit: Payer: Self-pay | Admitting: Gastroenterology

## 2016-10-03 ENCOUNTER — Telehealth: Payer: Self-pay

## 2016-10-03 DIAGNOSIS — K7460 Unspecified cirrhosis of liver: Secondary | ICD-10-CM

## 2016-10-03 DIAGNOSIS — R188 Other ascites: Principal | ICD-10-CM

## 2016-10-03 NOTE — Telephone Encounter (Signed)
Pt's son Sherren Mocha called and was wondering if we could start working his father back up taking the Lasix like he was previously before he was hospitalized. The fluid is slowly building back up. He recently had a paracentesis done with 6.6 liters removed. He is currently taking Lasix 60mg  and Spironolactone 50mg .   He WAS taking :  Lasix 60mg  in the AM and 40mg  in the PM at 4:00pm  Spironolactone 50mg  in the AM

## 2016-10-06 ENCOUNTER — Other Ambulatory Visit: Payer: Self-pay

## 2016-10-06 DIAGNOSIS — R188 Other ascites: Secondary | ICD-10-CM

## 2016-10-06 MED ORDER — ALBUMIN HUMAN 25 % IV SOLN: 25.0000 g | Freq: Once | INTRAVENOUS | Status: AC

## 2016-10-06 NOTE — Telephone Encounter (Signed)
That would be fine 

## 2016-10-06 NOTE — Telephone Encounter (Signed)
Per Dr. Allen Norris, [facility] to schedule paracentesis. Order placed.   Increase his Spironolactone to 100mg  daily  Continue with Lasix 40mg  in the AM and 40mg  in the PM  Advised son, Sherren Mocha pt will need his CMP checked this week due to increase in medications.

## 2016-10-06 NOTE — Telephone Encounter (Signed)
Pt's son, Sherren Mocha called today stating his dad has gained 10lbs over the last 2 weeks. He said his belly is getting tight. He would like [place] to order another paracentesis as they are worried about the upcoming storm.  He also stated he went ahead and up'd his Lasix to 40mg  in the AM and 40mg  in the PM on Saturday.

## 2016-10-08 ENCOUNTER — Other Ambulatory Visit
Admission: RE | Admit: 2016-10-08 | Discharge: 2016-10-08 | Disposition: A | Discharge status: Home / Self Care | Payer: Medicare Other | Source: Ambulatory Visit | Attending: Gastroenterology | Admitting: Gastroenterology

## 2016-10-08 DIAGNOSIS — K7460 Unspecified cirrhosis of liver: Secondary | ICD-10-CM | POA: Diagnosis present

## 2016-10-08 DIAGNOSIS — R188 Other ascites: Secondary | ICD-10-CM

## 2016-10-08 LAB — BASIC METABOLIC PANEL
Anion gap: 9 (ref 5–15)
BUN: 28 mg/dL — ABNORMAL HIGH (ref 6–20)
CALCIUM: 8.5 mg/dL — AB (ref 8.9–10.3)
CO2: 24 mmol/L (ref 22–32)
Chloride: 96 mmol/L — ABNORMAL LOW (ref 101–111)
Creatinine, Ser: 1.12 mg/dL (ref 0.61–1.24)
GFR calc Af Amer: >60 mL/min (ref >60)
GFR, EST NON AFRICAN AMERICAN: 55 mL/min — AB (ref >60)
GLUCOSE: 107 mg/dL — AB (ref 65–99)
Potassium: 4.4 mmol/L (ref 3.5–5.1)
Sodium: 129 mmol/L — ABNORMAL LOW (ref 135–145)

## 2016-10-09 ENCOUNTER — Emergency Department
Admission: EM | Admit: 2016-10-09 | Discharge: 2016-10-09 | Disposition: A | Discharge status: Home / Self Care | Payer: Medicare Other | Attending: Student in an Organized Health Care Education/Training Program | Admitting: Student in an Organized Health Care Education/Training Program

## 2016-10-09 ENCOUNTER — Emergency Department: Payer: Medicare Other

## 2016-10-09 DIAGNOSIS — R188 Other ascites: Secondary | ICD-10-CM

## 2016-10-09 DIAGNOSIS — Z79899 Other long term (current) drug therapy: Secondary | ICD-10-CM | POA: Diagnosis not present

## 2016-10-09 DIAGNOSIS — I2510 Atherosclerotic heart disease of native coronary artery without angina pectoris: Secondary | ICD-10-CM | POA: Insufficient documentation

## 2016-10-09 DIAGNOSIS — I5033 Acute on chronic diastolic (congestive) heart failure: Secondary | ICD-10-CM | POA: Diagnosis not present

## 2016-10-09 DIAGNOSIS — Z85828 Personal history of other malignant neoplasm of skin: Secondary | ICD-10-CM | POA: Diagnosis not present

## 2016-10-09 DIAGNOSIS — R140 Abdominal distension (gaseous): Secondary | ICD-10-CM | POA: Diagnosis present

## 2016-10-09 DIAGNOSIS — I110 Hypertensive heart disease with heart failure: Secondary | ICD-10-CM | POA: Insufficient documentation

## 2016-10-09 DIAGNOSIS — R198 Other specified symptoms and signs involving the digestive system and abdomen: Secondary | ICD-10-CM | POA: Insufficient documentation

## 2016-10-09 DIAGNOSIS — Z96651 Presence of right artificial knee joint: Secondary | ICD-10-CM | POA: Diagnosis not present

## 2016-10-09 LAB — COMPREHENSIVE METABOLIC PANEL
ALBUMIN: 2.8 g/dL — AB (ref 3.5–5.0)
ALK PHOS: 310 U/L — AB (ref 38–126)
ALT: 30 U/L (ref 17–63)
AST: 42 U/L — AB (ref 15–41)
Anion gap: 10 (ref 5–15)
BILIRUBIN TOTAL: 1.6 mg/dL — AB (ref 0.3–1.2)
BUN: 30 mg/dL — AB (ref 6–20)
CALCIUM: 8.6 mg/dL — AB (ref 8.9–10.3)
CO2: 23 mmol/L (ref 22–32)
CREATININE: 1.1 mg/dL (ref 0.61–1.24)
Chloride: 97 mmol/L — ABNORMAL LOW (ref 101–111)
GFR calc Af Amer: >60 mL/min (ref >60)
GFR, EST NON AFRICAN AMERICAN: 56 mL/min — AB (ref >60)
GLUCOSE: 148 mg/dL — AB (ref 65–99)
Potassium: 4.4 mmol/L (ref 3.5–5.1)
Sodium: 130 mmol/L — ABNORMAL LOW (ref 135–145)
TOTAL PROTEIN: 6.5 g/dL (ref 6.5–8.1)

## 2016-10-09 LAB — CBC
HCT: 35.1 % — ABNORMAL LOW (ref 40.0–52.0)
Hemoglobin: 11.9 g/dL — ABNORMAL LOW (ref 13.0–18.0)
MCH: 31.7 pg (ref 26.0–34.0)
MCHC: 34 g/dL (ref 32.0–36.0)
MCV: 93.1 fL (ref 80.0–100.0)
PLATELETS: 80 10*3/uL — AB (ref 150–440)
RBC: 3.77 MIL/uL — ABNORMAL LOW (ref 4.40–5.90)
RDW: 18 % — AB (ref 11.5–14.5)
WBC: 5.5 10*3/uL (ref 3.8–10.6)

## 2016-10-09 LAB — PROTIME-INR
INR: 1.5
PROTHROMBIN TIME: 18 s — AB (ref 11.4–15.2)

## 2016-10-09 LAB — LIPASE, BLOOD: Lipase: 24 U/L (ref 11–51)

## 2016-10-09 NOTE — ED Triage Notes (Signed)
Patient presents to ED via POV. Patient receives parecentesis regulary, Last paracentesis 2 weeks ago where they pulled 6 L off. Patient denies chest pain or shortness of breath. Patient is currently on Lasix and Spiranolactone.

## 2016-10-09 NOTE — ED Provider Notes (Signed)
[facility] Emergency Department Provider Note    First MD Initiated Contact with Patient 10/09/16 1151     (approximate)  I have reviewed the triage vital signs and the nursing notes.   HISTORY  Chief Complaint Abdominal distention    HPI Sean P Della Homan. is a 83 y.o. male history of skin cancer as well as congestive heart failure and liver cirrhosis with ascites requiring recurrent paracentesis presents from home with his son after having spontaneous drainage of large-volume yellow clear fluid. Denies any pain at this time. Did notice a new erosion around his hernia that he's had for many years. No pain at the site of hernia. No nausea or vomiting. No diarrhea. He has this to have paracentesis performed next week. States that he does feel that his abdomen is much less distended than it was prior to draining this morning.   Past Medical History:  Diagnosis Date  . Cancer ([place])    skin cancer  . CHF (congestive heart failure) ([place])   . Cirrhosis ([place])    Family History  Problem Relation Age of Onset  . Heart disease Father    Past Surgical History:  Procedure Laterality Date  . cardiac bypass    . CARDIAC SURGERY     quadruple bypass  . JOINT REPLACEMENT Right    knee   Patient Active Problem List   Diagnosis Date Noted  . Cellulitis 08/21/2016  . Mixed hyperlipidemia 12/18/2015  . Essential hypertension 12/18/2015  . CAD (coronary artery disease) 12/18/2015  . Arthritis 12/18/2015  . Acute on chronic diastolic CHF (congestive heart failure), NYHA class 3 ([place]) 12/12/2015  . Cirrhosis of liver with ascites ([place]) 03/26/2014  . Shortness of breath 08/24/2013      Prior to Admission medications   Medication Sig Start Date End Date Taking? Authorizing Provider  acetaminophen (TYLENOL) 650 MG CR tablet Take 650 mg by mouth every 8 (eight) hours as needed for pain.     [provider]  Ascorbic Acid (VITAMIN C) 1000 MG tablet Take  1,000 mg by mouth daily.     [provider]  Cyanocobalamin (RA VITAMIN B-12 TR) 1000 MCG TBCR Take 1,000 mcg by mouth daily.     [provider]  doxazosin (CARDURA) 4 MG tablet Take 4 mg by mouth daily.  12/07/15   [provider]  doxycycline (VIBRA-TABS) 100 MG tablet Take 1 tablet (100 mg total) by mouth every 12 (twelve) hours. X 7 days 08/24/16   Gladstone Lighter, MD  fexofenadine (ALLEGRA) 180 MG tablet Take 180 mg by mouth daily.  07/21/13   [provider]  fluticasone (FLONASE) 50 MCG/ACT nasal spray USE ONE SPRAY IN EACH NOSTRIL EVERY DAY 10/30/15   [provider]  Fluticasone-Salmeterol (ADVAIR DISKUS) 250-50 MCG/DOSE AEPB Inhale 1 puff into the lungs 2 (two) times daily. 08/24/16 08/24/17  Gladstone Lighter, MD  furosemide (LASIX) 40 MG tablet Take 1 tablet (40 mg total) by mouth daily. 08/25/16   Gladstone Lighter, MD  gabapentin (NEURONTIN) 300 MG capsule Take 300 mg by mouth daily.     [provider]  ipratropium-albuterol (DUONEB) 0.5-2.5 (3) MG/3ML SOLN Take 3 mLs by nebulization every 6 (six) hours as needed. 08/25/16   Gladstone Lighter, MD  Multiple Vitamin (MULTI-VITAMINS) TABS Take 1 tablet by mouth daily.     [provider]  rosuvastatin (CRESTOR) 5 MG tablet Take 5 mg by mouth at bedtime.     [provider]  spironolactone (ALDACTONE) 50 MG tablet TAKE ONE (1) TABLET BY MOUTH EVERY DAY 10/01/16   Lucilla Lame, MD    Allergies Hydrocodone    Social History Social History  Substance Use Topics  . Smoking status: Never Smoker  . Smokeless tobacco: Never Used  . Alcohol use No    Review of Systems Patient denies headaches, rhinorrhea, blurry vision, numbness, shortness of breath, chest pain, edema, cough, abdominal pain, nausea, vomiting, diarrhea, dysuria, fevers, rashes or hallucinations unless otherwise stated above in HPI. ____________________________________________   PHYSICAL  EXAM:  VITAL SIGNS: Vitals:   10/09/16 1135 10/09/16 1136  BP:  (!) 101/41  Pulse:  88  Resp:  19  Temp:  97.6 F (36.4 C)  SpO2: 98% 98%    Constitutional: Alert and oriented. Well appearing and in no acute distress. Eyes: Conjunctivae are normal.  Head: Atraumatic. Nose: No congestion/rhinnorhea. Mouth/Throat: Mucous membranes are moist.   Neck: No stridor. Painless ROM.  Cardiovascular: Normal rate, regular rhythm. Grossly normal heart sounds.  Good peripheral circulation. Respiratory: Normal respiratory effort.  No retractions. Lungs CTAB. Gastrointestinal: Soft and nontender large umbilical hernia with 1cm skin erosion, no purulence or erthema.  Hernia is non tender and reducible. No distention. No abdominal bruits. No CVA tenderness. Musculoskeletal: No lower extremity tenderness nor edema.  No joint effusions. Neurologic:  Normal speech and language. No gross focal neurologic deficits are appreciated. No facial droop Skin:  Skin is warm, dry and intact. No rash noted. Psychiatric: Mood and affect are normal. Speech and behavior are normal.  ____________________________________________   LABS (all labs ordered are listed, but only abnormal results are displayed)  Results for orders placed or performed during the hospital encounter of 10/09/16 (from the past 24 hour(s))  Lipase, blood     Status: None   Collection Time: 10/09/16 11:40 AM  Result Value Ref Range   Lipase 24 11 - 51 U/L  Comprehensive metabolic panel     Status: Abnormal   Collection Time: 10/09/16 11:40 AM  Result Value Ref Range   Sodium 130 (L) 135 - 145 mmol/L   Potassium 4.4 3.5 - 5.1 mmol/L   Chloride 97 (L) 101 - 111 mmol/L   CO2 23 22 - 32 mmol/L   Glucose, Bld 148 (H) 65 - 99 mg/dL   BUN 30 (H) 6 - 20 mg/dL   Creatinine, Ser 1.10 0.61 - 1.24 mg/dL   Calcium 8.6 (L) 8.9 - 10.3 mg/dL   Total Protein 6.5 6.5 - 8.1 g/dL   Albumin 2.8 (L) 3.5 - 5.0 g/dL   AST 42 (H) 15 - 41 U/L   ALT 30 17 -  63 U/L   Alkaline Phosphatase 310 (H) 38 - 126 U/L   Total Bilirubin 1.6 (H) 0.3 - 1.2 mg/dL   GFR calc non Af Amer 56 (L) >60 mL/min   GFR calc Af Amer >60 >60 mL/min   Anion gap 10 5 - 15  CBC     Status: Abnormal   Collection Time: 10/09/16 11:40 AM  Result Value Ref Range   WBC 5.5 3.8 - 10.6 K/uL   RBC 3.77 (L) 4.40 - 5.90 MIL/uL   Hemoglobin 11.9 (L) 13.0 - 18.0 g/dL   HCT 35.1 (L) 40.0 - 52.0 %   MCV 93.1 80.0 - 100.0 fL   MCH 31.7 26.0 - 34.0 pg   MCHC 34.0 32.0 - 36.0 g/dL   RDW 18.0 (H) 11.5 - 14.5 %   Platelets 80 (  L) 150 - 440 K/uL  Protime-INR     Status: Abnormal   Collection Time: 10/09/16 11:56 AM  Result Value Ref Range   Prothrombin Time 18.0 (H) 11.4 - 15.2 seconds   INR 1.50    ____________________________________________ ____________________________________________  RADIOLOGY   ____________________________________________   PROCEDURES  Procedure(s) performed:  Procedures    Critical Care performed: no ____________________________________________   INITIAL IMPRESSION / ASSESSMENT AND PLAN / ED COURSE  Pertinent labs & imaging results that were available during my care of the patient were reviewed by me and considered in my medical decision making (see chart for details).  DDX: spontaneous rupture of peritoneum, hernia, fistula, cellulitis, abscess  Sean Jensen. is a 83 y.o. who presents to the ED with Spontaneous drainage of ascitic fluid. His abdominal exam is currently soft and nondistended. The hernia is easily reducible and nontender. He has no evidence of surrounding infection. We'll check blood work for the above differential. A spoke with Dr. wall of GI who recommends touching base with surgery to see if they would consider oversewing the draining area.  Clinical Course as of Oct 10 1307  Thu Oct 09, 2016  1242 I spoke with Dr. Dahlia Byes who does not recommend suturing through skin at this time.  Patient remains stable at this time.   [PR]    Clinical Course User Index [PR] Merlyn Lot, MD    blood work is reassuring and neck patient's baseline. He denies any pain or discomfort at this time. Draining area was covered with Dermabond. He has no evidence of infectious process. At this point do feel that he is stable and appropriate for discharge home. ____________________________________________   FINAL CLINICAL IMPRESSION(S) / ED DIAGNOSES  Final diagnoses:  Poorly controlled ascites      NEW MEDICATIONS STARTED DURING THIS VISIT:  New Prescriptions   No medications on file     Note:  This document was prepared using Dragon voice recognition software and may include unintentional dictation errors.    Merlyn Lot, MD 10/09/16 1311

## 2016-10-09 NOTE — ED Notes (Signed)
EDP at bedside  

## 2016-10-09 NOTE — Discharge Instructions (Signed)

## 2016-10-10 ENCOUNTER — Telehealth: Payer: Self-pay

## 2016-10-10 NOTE — Telephone Encounter (Signed)
Pt was seen in the ER yesterday at [facility] CMP was done. Results given during that visit.

## 2016-10-10 NOTE — Telephone Encounter (Signed)
-----   Message from Lucilla Lame, MD sent at 10/10/2016  9:30 AM EDT ----- The patient know that his creatinine is stable.

## 2016-10-13 ENCOUNTER — Ambulatory Visit
Admission: RE | Admit: 2016-10-13 | Discharge: 2016-10-13 | Disposition: A | Discharge status: Home / Self Care | Payer: Medicare Other | Source: Ambulatory Visit | Attending: Diagnostic Radiology | Admitting: Diagnostic Radiology

## 2016-10-13 ENCOUNTER — Other Ambulatory Visit: Payer: Self-pay | Admitting: Gastroenterology

## 2016-10-13 ENCOUNTER — Other Ambulatory Visit: Payer: Self-pay

## 2016-10-13 ENCOUNTER — Ambulatory Visit
Admission: RE | Admit: 2016-10-13 | Discharge: 2016-10-13 | Disposition: A | Discharge status: Home / Self Care | Payer: Medicare Other | Source: Ambulatory Visit | Attending: Gastroenterology | Admitting: Gastroenterology

## 2016-10-13 DIAGNOSIS — Z9889 Other specified postprocedural states: Secondary | ICD-10-CM

## 2016-10-13 DIAGNOSIS — R188 Other ascites: Secondary | ICD-10-CM | POA: Diagnosis not present

## 2016-10-13 DIAGNOSIS — J90 Pleural effusion, not elsewhere classified: Secondary | ICD-10-CM

## 2016-10-13 NOTE — Procedures (Signed)
Minimal ascites but large right pleural effusion.  US guided right thoracentesis.  Removed 1.8 liters of yellow cloudy fluid.  Stopped draining fluid due to patient discomfort and coughing.  No immediate complication.  Minimal blood loss.  CXR pending.

## 2016-10-14 NOTE — Progress Notes (Signed)
We received a fax from Dr. Jeneen Rinks Hedrick's office as an "urgent" referral.  Dr. Bary Castilla was paged.  Dr. Bary Castilla reviewed the patient's chart in [facility].  He reports that patient was in the ER recently and Dr. Dahlia Byes was consulted.  Also, patient is not medically stable for any type of surgical intervention at this time. Dr. Bary Castilla does not believe the hernia is related to the patient's current symptoms.  Patient will likely need to be admitted by the hospitalists for medical stabilization.    I called Dr. Barbarann Ehlers office at 12:32 pm.  Both Dr. Kary Kos and his nurse were at lunch.  The receptionist took a detailed message that will be sent to Dr. Kary Kos.

## 2016-10-15 ENCOUNTER — Telehealth: Payer: Self-pay | Admitting: General Practice

## 2016-10-15 NOTE — Telephone Encounter (Signed)
Patients son called and left a message on Tuesday @ 2:46pm patients father has a small pin hole that is leaking from the tip of his umbilical hernia, patient was seen in the ER and they put liquid skin, but the patients son said it has been leaking since, and is asking if there is anything they can do or can he have stiches. Please call patient and advice.

## 2016-10-15 NOTE — Telephone Encounter (Signed)
Called Mr. Lorella Nimrod (patient's son) and he stated that his father had a small opening on his umbilical that continues to drain. I told him that after reviewing his father's chart, I saw that the opening was to drain. I told him that his father has ascites and that when he went to the ED, he was recommended by our surgeon to keep it open instead of suturing it. I told Sherren Mocha to try to keep the opening as dry as possible and that it should heal from the inside out. I told him that it's a nuisance but it will eventually close on it's own. Todd understood and had no further questions at this time. I told him to give [place] a call if he had further questions.

## 2016-10-24 ENCOUNTER — Other Ambulatory Visit: Payer: Self-pay

## 2016-10-24 MED ORDER — SPIRONOLACTONE 50 MG PO TABS: 50.0000 mg | ORAL_TABLET | Freq: Two times a day (BID) | ORAL | 5 refills | Status: AC

## 2016-10-25 ENCOUNTER — Ambulatory Visit
Admission: RE | Admit: 2016-10-25 | Discharge: 2016-10-25 | Disposition: A | Discharge status: Home / Self Care | Payer: Medicare Other | Source: Ambulatory Visit | Attending: Family Medicine | Admitting: Family Medicine

## 2016-10-25 DIAGNOSIS — R300 Dysuria: Secondary | ICD-10-CM | POA: Diagnosis not present

## 2016-10-25 LAB — URINALYSIS, ROUTINE W REFLEX MICROSCOPIC
BILIRUBIN URINE: NEGATIVE
Glucose, UA: NEGATIVE mg/dL
HGB URINE DIPSTICK: NEGATIVE
Ketones, ur: NEGATIVE mg/dL
Leukocytes, UA: NEGATIVE
Nitrite: NEGATIVE
PROTEIN: NEGATIVE mg/dL
Specific Gravity, Urine: 1.014 (ref 1.005–1.030)
pH: 6 (ref 5.0–8.0)

## 2016-10-26 LAB — URINE CULTURE

## 2016-11-27 DEATH — deceased

## 2019-05-23 IMAGING — US US EXTREM LOW VENOUS*R*
1 series · 13 of 24 positions shown · non-contrast
Comparison: None.

CLINICAL DATA: Right lower extremity swelling for 3 days



[Series 1: us extrem low venous*right* · 0.08mm/px · 13 of 32 slices shown]
[im 1/32]
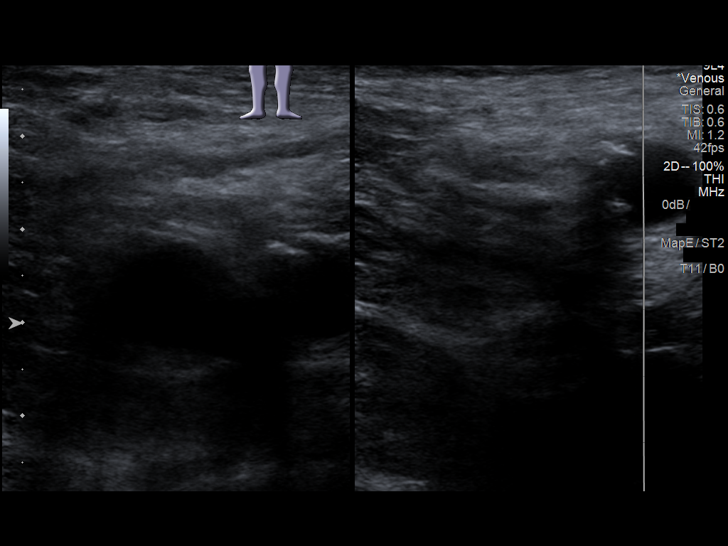
[im 3/32]
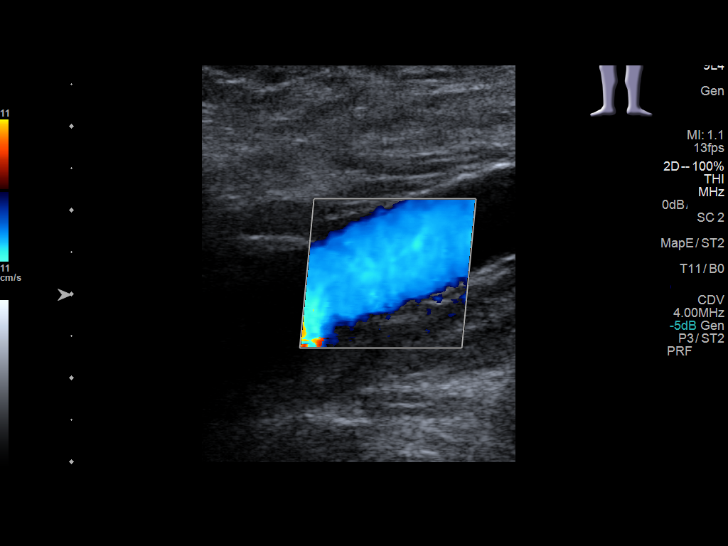
[im 6/32]
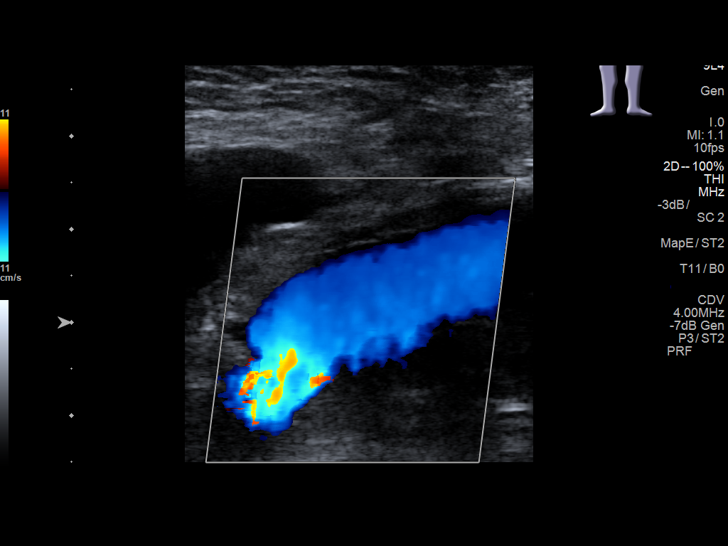
[im 9/32]
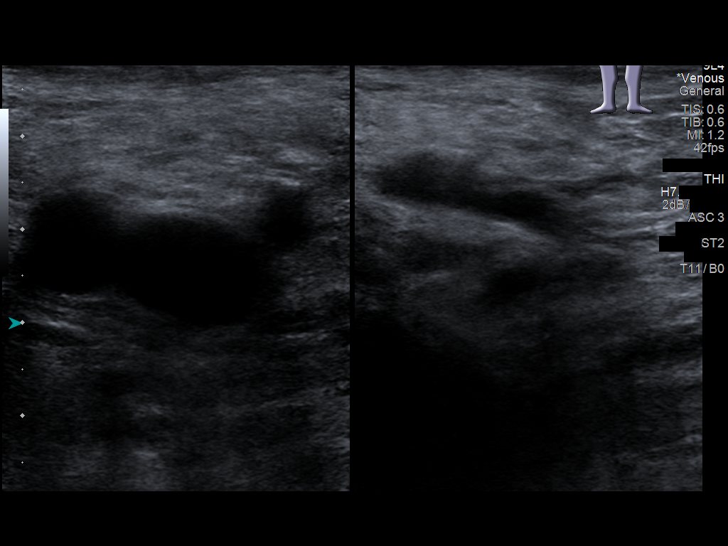
[im 11/32]
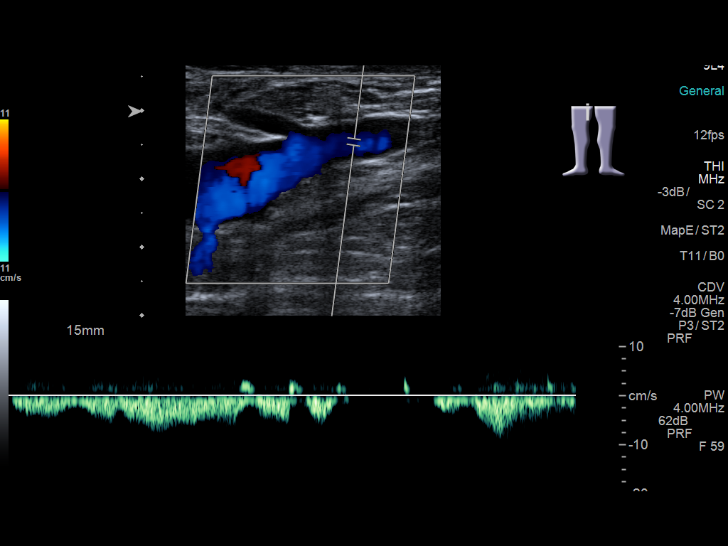
[im 14/32]
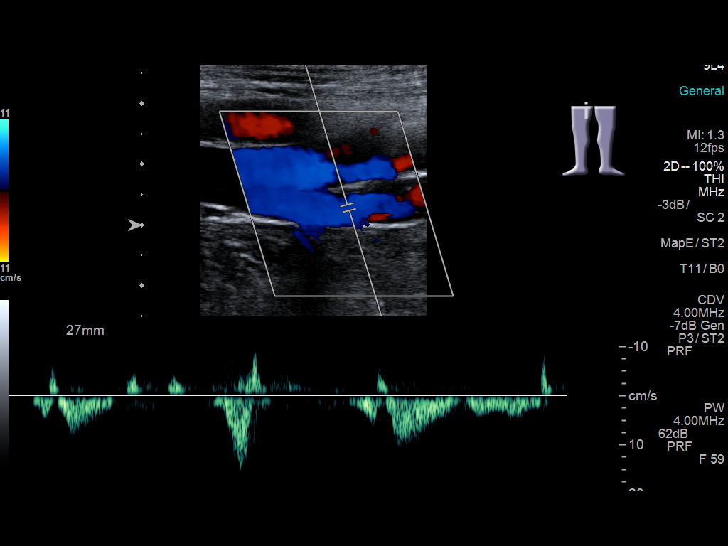
[im 17/32]
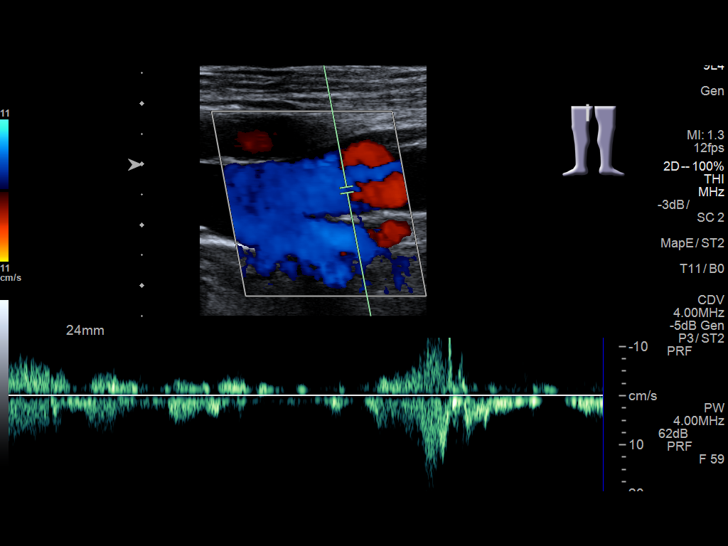
[im 18/32]
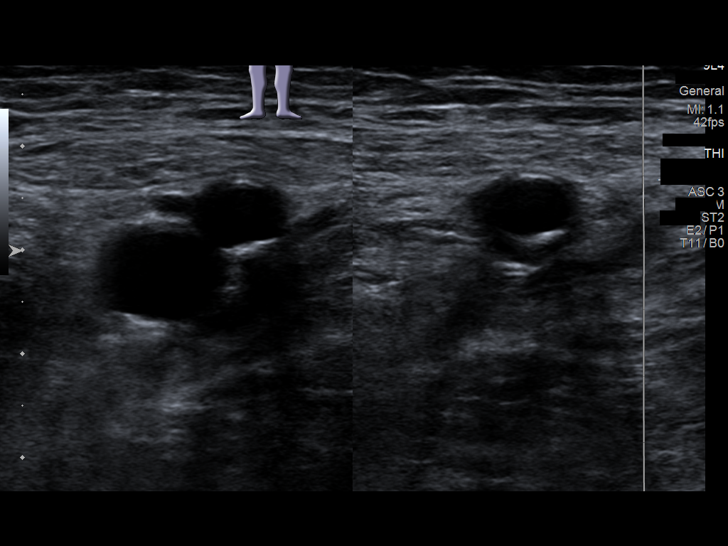
[im 21/32]
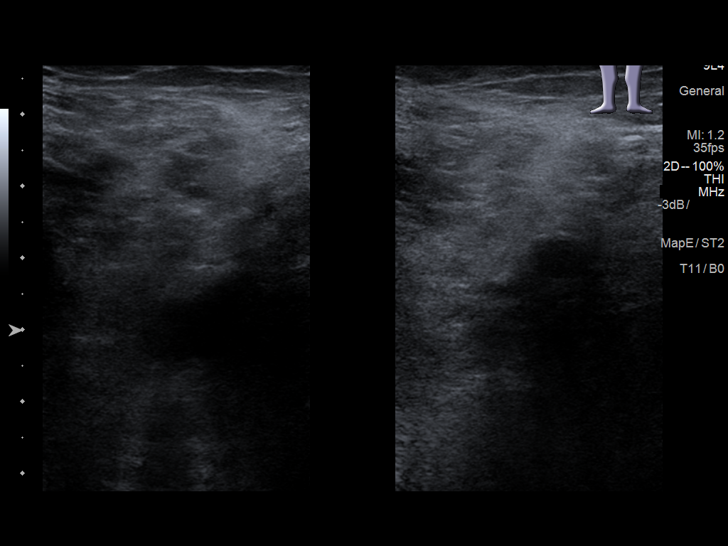
[im 23/32]
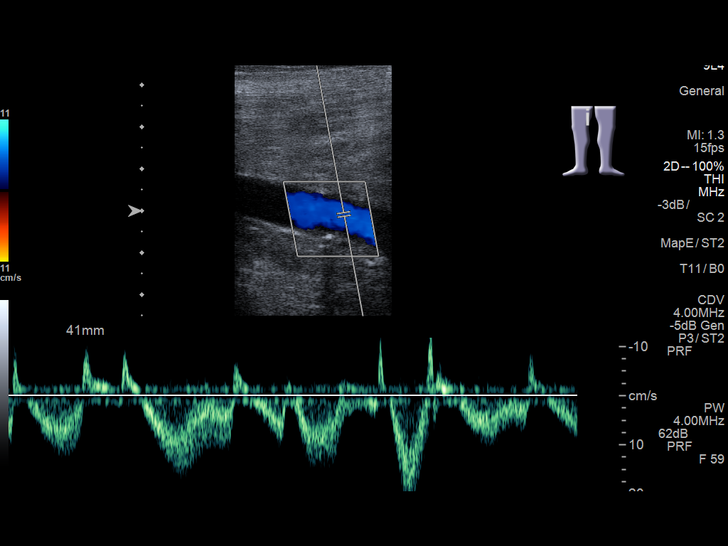
[im 26/32]
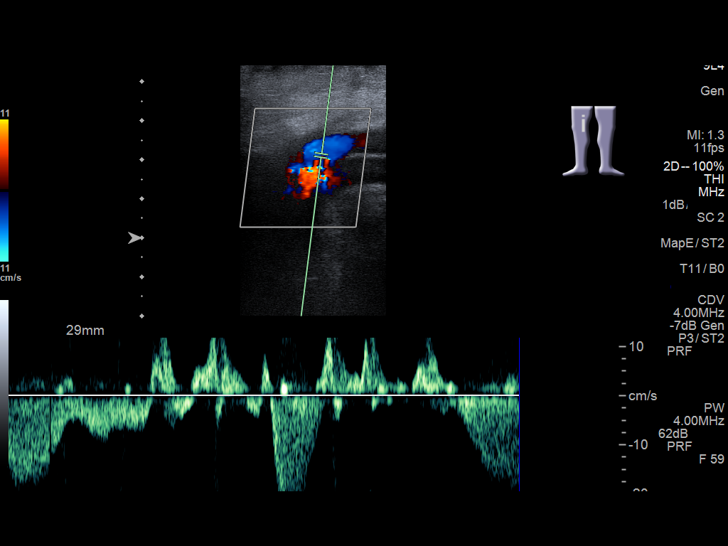
[im 29/32]
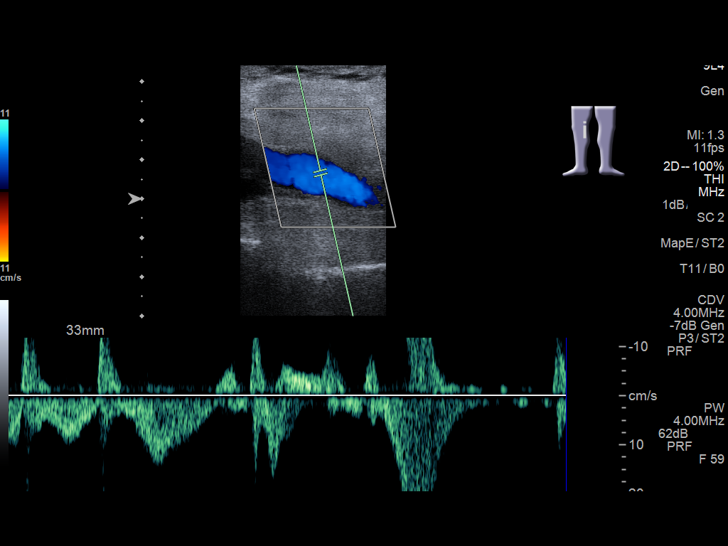
[im 32/32]
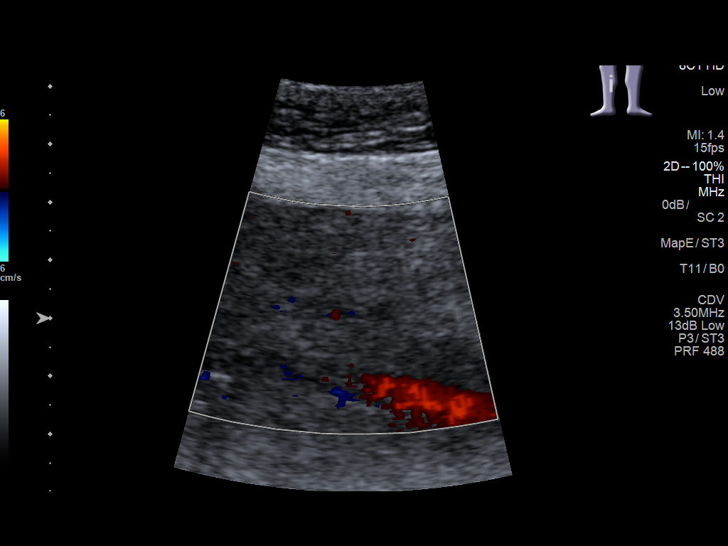

[13 of 24 positions shown; findings below may reference images not displayed]

FINDINGS: Contralateral Common Femoral Vein: Respiratory phasicity is normal
and symmetric with the symptomatic side. No evidence of thrombus.
Normal compressibility.

Common Femoral Vein: No evidence of thrombus. Normal
compressibility, respiratory phasicity and response to augmentation.

Saphenofemoral Junction: No evidence of thrombus. Normal
compressibility and flow on color Doppler imaging.

Profunda Femoral Vein: No evidence of thrombus. Normal
compressibility and flow on color Doppler imaging.

Femoral Vein: No evidence of thrombus. Normal compressibility,
respiratory phasicity and response to augmentation.

Popliteal Vein: No evidence of thrombus. Normal compressibility,
respiratory phasicity and response to augmentation.

Calf Veins: No evidence of thrombus. Normal compressibility and flow
on color Doppler imaging. Right peroneal vein is not well seen.

Superficial Great Saphenous Vein: No evidence of thrombus. Normal
compressibility and flow on color Doppler imaging.

Venous Reflux:  None.

Other Findings:  None.
IMPRESSION: No evidence of DVT within the right lower extremity.

## 2019-06-24 IMAGING — US US PARACENTESIS
1 series · 3 of 3 positions shown · non-contrast
Comparison: none

INDICATION: Cirrhosis and recurrent ascites.

[Series 1: us paracentesis · 0.26mm/px · 3 of 3 slices shown]
[im 1/3]
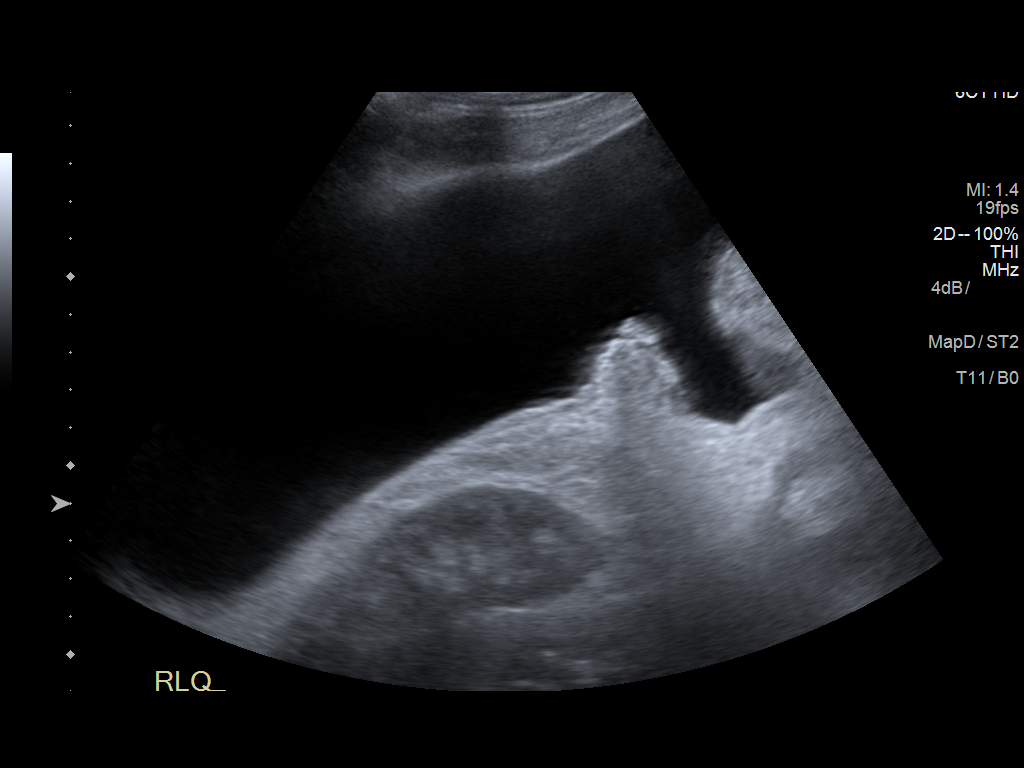
[im 2/3]
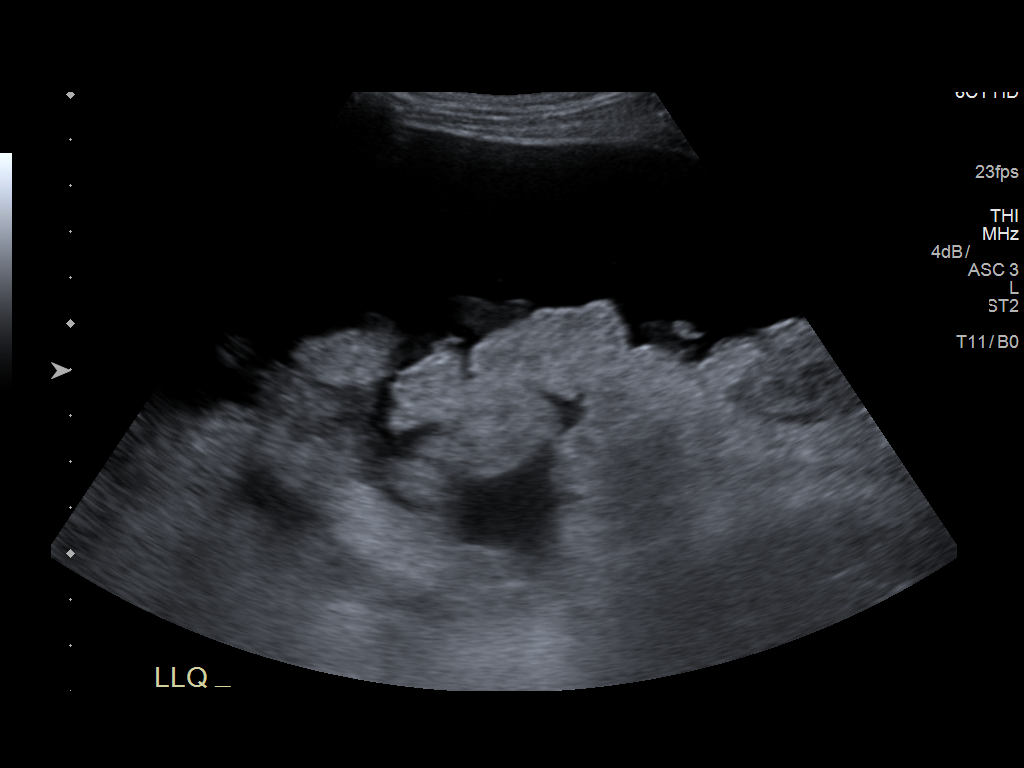
[im 3/3]
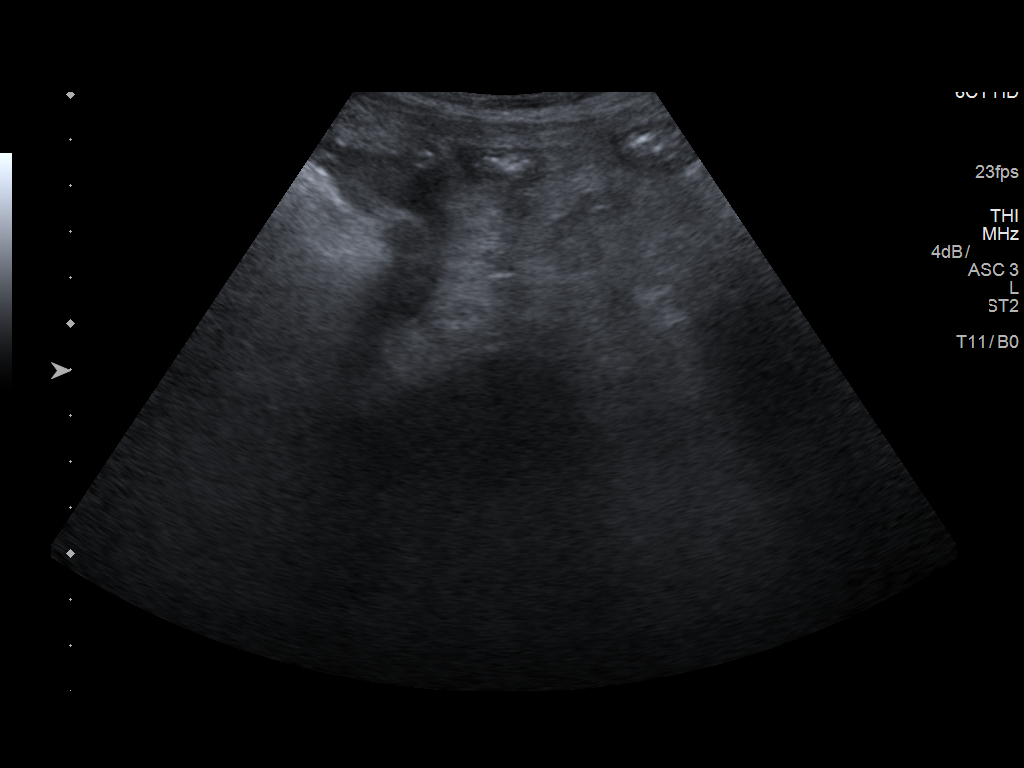

[3 of 3 positions shown; findings below may reference images not displayed]

EXAM:
ULTRASOUND GUIDED PARACENTESIS

MEDICATIONS:
None.

COMPLICATIONS:
None immediate.

PROCEDURE:
Informed written consent was obtained from the patient after a
discussion of the risks, benefits and alternatives to treatment. A
timeout was performed prior to the initiation of the procedure.

Initial ultrasound scanning was performed to localize ascites. The
right lower abdomen was prepped with chlorhexidine and draped in the
usual sterile fashion. 1% lidocaine was used for local anesthesia.

Following this, a 6 Fr Safe-T-Centesis catheter was introduced. An
ultrasound image was saved for documentation purposes. The
paracentesis was performed. The catheter was removed and a dressing
was applied. The patient tolerated the procedure well without
immediate post procedural complication.
FINDINGS: A total of approximately 6.6 L of yellowish fluid was removed.
IMPRESSION: Successful ultrasound-guided paracentesis yielding 6.6 liters of
peritoneal fluid.

## 2019-07-15 IMAGING — US US ABDOMEN LIMITED
1 series · 4 of 4 positions shown · non-contrast
Comparison: Paracentesis sonography 09/22/2016

CLINICAL DATA: Ascites

EXAM:
LIMITED ABDOMEN ULTRASOUND FOR ASCITES
TECHNIQUE: Limited ultrasound survey for ascites was performed in all four
abdominal quadrants.

[Series 1: us abdomen limited · 0.25mm/px · 4 of 4 slices shown]
[im 1/4]
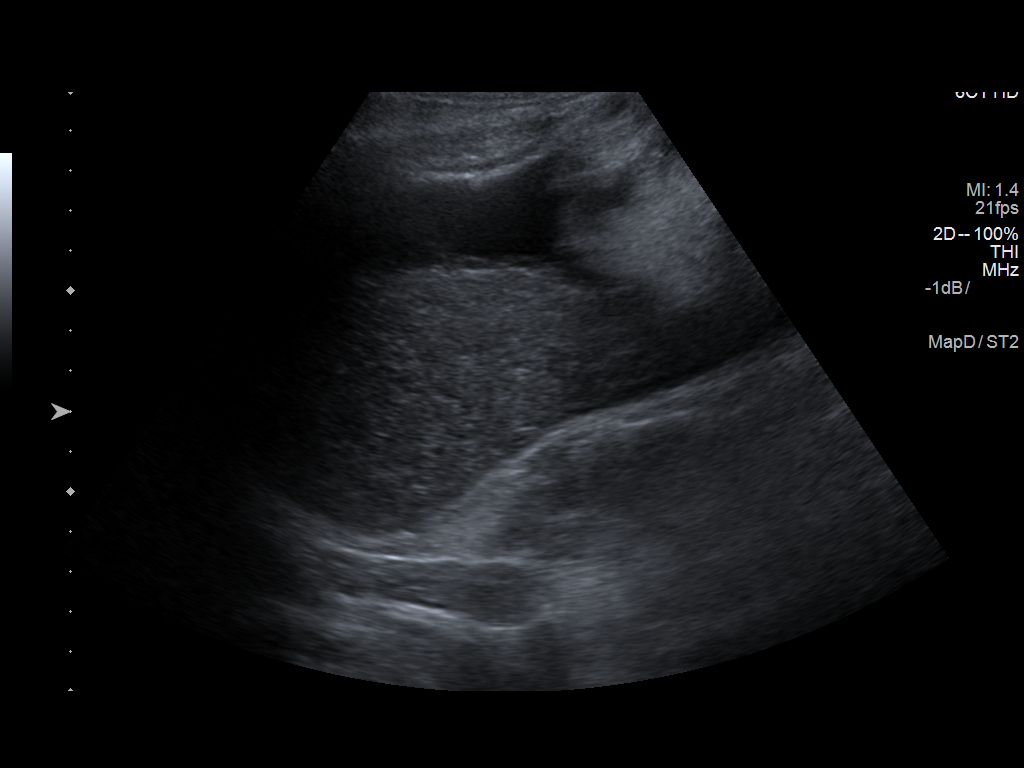
[im 2/4]
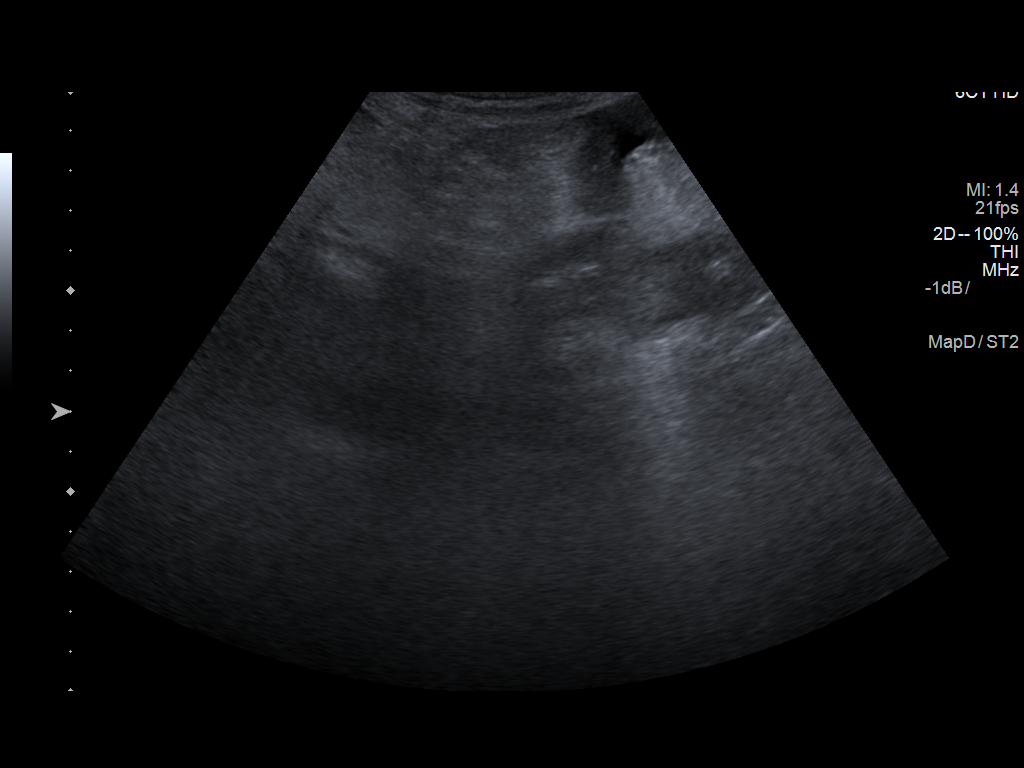
[im 3/4]
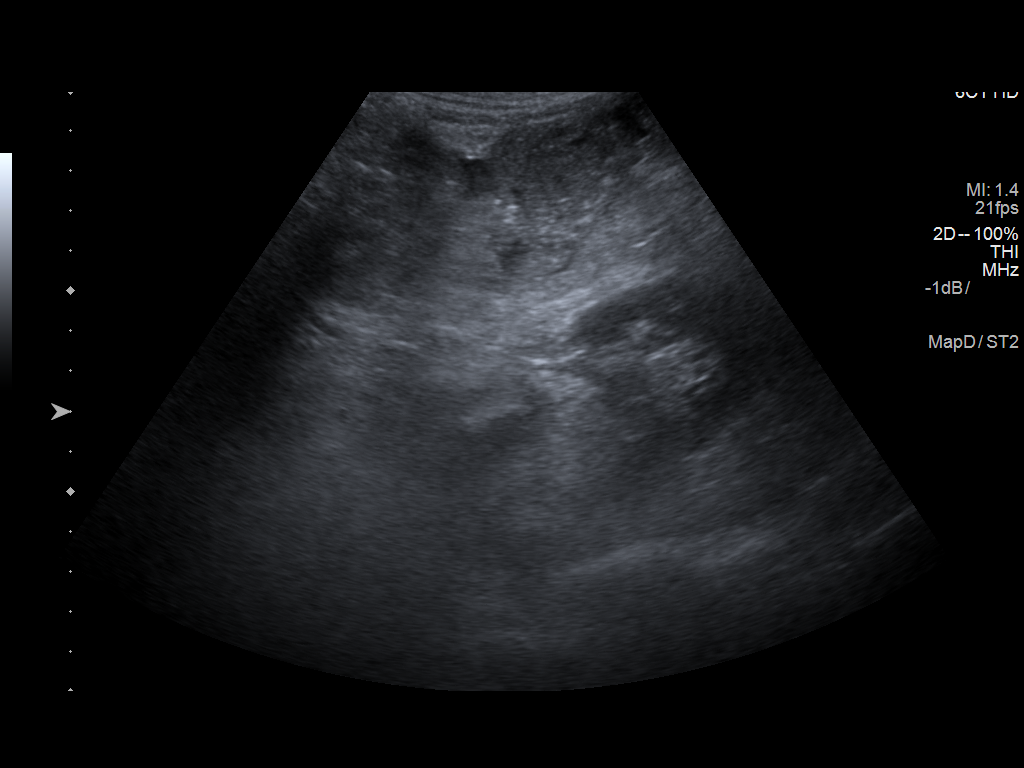
[im 4/4]
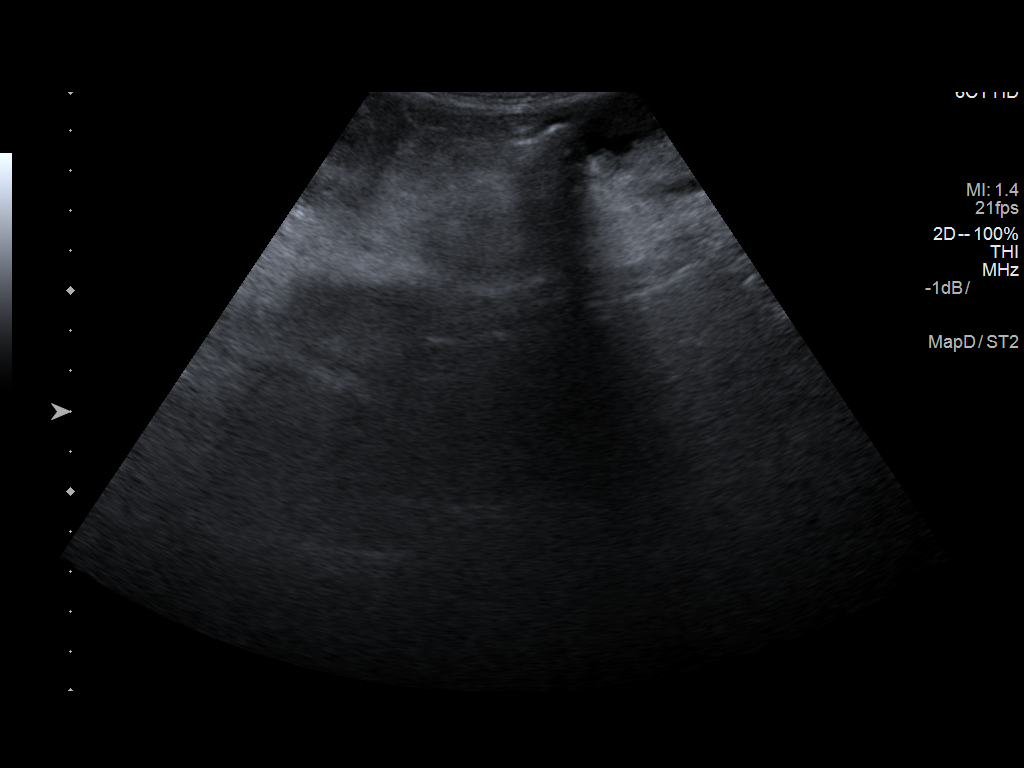

[4 of 4 positions shown; findings below may reference images not displayed]

FINDINGS: Small volume ascites, with largest pocket in the right upper
quadrant measuring 2 cm between the abdominal wall and cirrhotic
liver. The volume of is too small for therapeutic paracentesis. No
internal complexity is seen.
IMPRESSION: Small volume ascites as described.
